# Patient Record
Sex: Male | Born: 1945
Health system: Southern US, Community
[De-identification: ages and names within clinical notes are randomized; demographics above are authoritative.]

## PROBLEM LIST (undated history)

## (undated) DIAGNOSIS — U071 COVID-19: Secondary | ICD-10-CM

## (undated) HISTORY — PX: HERNIA REPAIR: SHX51

---

## 2010-01-17 ENCOUNTER — Ambulatory Visit (HOSPITAL_COMMUNITY): Admission: RE | Admit: 2010-01-17 | Discharge: 2010-01-17 | Payer: Self-pay | Admitting: Surgery

## 2013-03-05 ENCOUNTER — Encounter (HOSPITAL_COMMUNITY): Payer: Self-pay | Admitting: *Deleted

## 2013-03-05 ENCOUNTER — Emergency Department (HOSPITAL_COMMUNITY)
Admission: EM | Admit: 2013-03-05 | Discharge: 2013-03-06 | Disposition: A | Discharge status: Home / Self Care | Payer: Medicare Other | Attending: Emergency Medicine | Admitting: Emergency Medicine

## 2013-03-05 DIAGNOSIS — R062 Wheezing: Secondary | ICD-10-CM | POA: Insufficient documentation

## 2013-03-05 DIAGNOSIS — J209 Acute bronchitis, unspecified: Secondary | ICD-10-CM | POA: Insufficient documentation

## 2013-03-05 DIAGNOSIS — J40 Bronchitis, not specified as acute or chronic: Secondary | ICD-10-CM

## 2013-03-05 DIAGNOSIS — J3489 Other specified disorders of nose and nasal sinuses: Secondary | ICD-10-CM | POA: Insufficient documentation

## 2013-03-05 DIAGNOSIS — R0982 Postnasal drip: Secondary | ICD-10-CM | POA: Insufficient documentation

## 2013-03-05 DIAGNOSIS — R05 Cough: Secondary | ICD-10-CM | POA: Insufficient documentation

## 2013-03-05 DIAGNOSIS — R059 Cough, unspecified: Secondary | ICD-10-CM | POA: Insufficient documentation

## 2013-03-05 DIAGNOSIS — F17200 Nicotine dependence, unspecified, uncomplicated: Secondary | ICD-10-CM | POA: Insufficient documentation

## 2013-03-05 DIAGNOSIS — R42 Dizziness and giddiness: Secondary | ICD-10-CM | POA: Insufficient documentation

## 2013-03-05 MED ORDER — ALBUTEROL SULFATE (5 MG/ML) 0.5% IN NEBU
5.0000 mg | INHALATION_SOLUTION | Freq: Once | RESPIRATORY_TRACT | Status: AC
  Administered 2013-03-05: 5 mg via RESPIRATORY_TRACT

## 2013-03-05 MED ORDER — ALBUTEROL SULFATE (5 MG/ML) 0.5% IN NEBU
INHALATION_SOLUTION | RESPIRATORY_TRACT | Status: AC
  Administered 2013-03-05: 5 mg via RESPIRATORY_TRACT
  Filled 2013-03-05: qty 1

## 2013-03-05 NOTE — ED Notes (Signed)
Pt started getting a cold 2 days ago and now states he "can't breathe".  Pt tachypnic, SOB, productive cough with yellow/green sputum.

## 2013-03-06 ENCOUNTER — Emergency Department (HOSPITAL_COMMUNITY): Payer: Medicare Other

## 2013-03-06 LAB — BASIC METABOLIC PANEL
CO2: 27 mEq/L (ref 19–32)
Calcium: 9.5 mg/dL (ref 8.4–10.5)
Creatinine, Ser: 0.88 mg/dL (ref 0.50–1.35)
GFR calc Af Amer: >90 mL/min (ref >90)
GFR calc non Af Amer: 88 mL/min — ABNORMAL LOW (ref >90)
Glucose, Bld: 119 mg/dL — ABNORMAL HIGH (ref 70–99)
Sodium: 136 mEq/L (ref 135–145)

## 2013-03-06 LAB — CBC
Hemoglobin: 17.2 g/dL — ABNORMAL HIGH (ref 13.0–17.0)
MCH: 31.6 pg (ref 26.0–34.0)
MCHC: 35.7 g/dL (ref 30.0–36.0)
MCV: 88.4 fL (ref 78.0–100.0)
RDW: 13 % (ref 11.5–15.5)
WBC: 14.7 10*3/uL — ABNORMAL HIGH (ref 4.0–10.5)

## 2013-03-06 MED ORDER — ALBUTEROL SULFATE (5 MG/ML) 0.5% IN NEBU
5.0000 mg | INHALATION_SOLUTION | RESPIRATORY_TRACT | Status: AC
  Administered 2013-03-06: 5 mg via RESPIRATORY_TRACT
  Filled 2013-03-06: qty 1

## 2013-03-06 MED ORDER — AZITHROMYCIN 250 MG PO TABS
500.0000 mg | ORAL_TABLET | Freq: Once | ORAL | Status: AC
  Administered 2013-03-06: 500 mg via ORAL
  Filled 2013-03-06: qty 2

## 2013-03-06 MED ORDER — IPRATROPIUM BROMIDE 0.02 % IN SOLN
0.5000 mg | Freq: Once | RESPIRATORY_TRACT | Status: AC
  Administered 2013-03-06: 0.5 mg via RESPIRATORY_TRACT
  Filled 2013-03-06: qty 2.5

## 2013-03-06 MED ORDER — ALBUTEROL SULFATE HFA 108 (90 BASE) MCG/ACT IN AERS: 1.0000 | INHALATION_SPRAY | Freq: Four times a day (QID) | RESPIRATORY_TRACT | Status: AC | PRN

## 2013-03-06 MED ORDER — AZITHROMYCIN 250 MG PO TABS: 250.0000 mg | ORAL_TABLET | Freq: Every day | ORAL | Status: DC

## 2013-03-06 NOTE — ED Provider Notes (Signed)
History     CSN: [id number]  Arrival date & time 03/05/13  2332   First MD Initiated Contact with Patient 03/06/13 0131      Chief Complaint  Patient presents with  . Shortness of Breath    (Consider location/radiation/quality/duration/timing/severity/associated sxs/prior treatment) HPI Comments: 67 yo white male w/ no significant PMH presents to the ED complaining of progressively worsening SOB x 24 hours. He states it is worse w/ exertion and laying down but denies having to sleep w/ pillows or sitting up. Assoc. sx include cough productive of green sputum, sinus congestion.Social history reveals he smokes 1pack/ day.   On ROS, pt denies fevers/ chills, myalgias, pleuritic pain, chest pain, heart palpitations, dizziness or weakness, swelling in the hands or feet.  Patient is a 67 y.o. male presenting with shortness of breath. The history is provided by the patient.  Shortness of Breath Associated symptoms: cough and wheezing   Associated symptoms: no chest pain, no fever, no headaches and no neck pain     History reviewed. No pertinent past medical history.  Past Surgical History  Procedure Laterality Date  . Hernia repair      No family history on file.  History  Substance Use Topics  . Smoking status: Current Every Day Smoker -- 1.00 packs/day  . Smokeless tobacco: Not on file  . Alcohol Use: Yes      Review of Systems  Constitutional: Positive for activity change. Negative for fever and chills.  HENT: Positive for congestion, rhinorrhea and postnasal drip. Negative for neck pain.   Eyes: Negative for visual disturbance.  Respiratory: Positive for cough, shortness of breath and wheezing. Negative for chest tightness.   Cardiovascular: Negative for chest pain.  Gastrointestinal: Negative for abdominal distention.  Genitourinary: Negative for dysuria, enuresis and difficulty urinating.  Musculoskeletal: Negative for arthralgias.  Neurological: Positive for  dizziness. Negative for light-headedness and headaches.  Psychiatric/Behavioral: Negative for confusion.    Allergies  Review of patient's allergies indicates no known allergies.  Home Medications  No current outpatient prescriptions on file.  BP 136/68  Pulse 66  Temp(Src) 97.8 F (36.6 C) (Oral)  Resp 15  SpO2 95%  Physical Exam  Nursing note and vitals reviewed. Constitutional: He is oriented to person, place, and time. He appears well-developed.  HENT:  Head: Normocephalic and atraumatic.  Eyes: Conjunctivae and EOM are normal. Pupils are equal, round, and reactive to light.  Neck: Normal range of motion. Neck supple.  Cardiovascular: Normal rate and regular rhythm.   Pulmonary/Chest: Effort normal. He has wheezes.  Fine wheezing with expiration - left sided  Abdominal: Soft. Bowel sounds are normal. He exhibits no distension. There is no tenderness. There is no rebound and no guarding.  Neurological: He is alert and oriented to person, place, and time.  Skin: Skin is warm.    ED Course  Procedures (including critical care time)  Labs Reviewed  BASIC METABOLIC PANEL - Abnormal; Notable for the following:    Glucose, Bld 119 (*)    GFR calc non Af Amer 88 (*)    All other components within normal limits  CBC - Abnormal; Notable for the following:    WBC 14.7 (*)    Hemoglobin 17.2 (*)    All other components within normal limits  POCT I-STAT TROPONIN I   Dg Chest 2 View (if Patient Has Fever And/or Copd)  03/06/2013  *RADIOLOGY REPORT*  Clinical Data: Worsening shortness of breath, congestion and chills.  History of  smoking.  CHEST - 2 VIEW  Comparison: Chest radiograph performed 01/17/2010  Findings: The lungs are well-aerated.  Minimal left basilar opacity may reflect mild pneumonia.  There is no evidence of pleural effusion or pneumothorax.  The heart is normal in size; the mediastinal contour is within normal limits.  No acute osseous abnormalities are seen.   IMPRESSION: Minimal left basilar opacity may reflect mild pneumonia.   Original Report Authenticated By: Tonia Ghent, M.D.      No diagnosis found.    MDM  Differential diagnosis includes: ACS syndrome CHF exacerbation Valvular disorder Myocarditis Pericarditis Pericardial effusion Pneumonia Pleural effusion Pulmonary edema PE Anemia Musculoskeletal pain Bronchitis Pneumonia  Pt comes in with cc of DIM with some cough and wheezing. He is a smoker. No underlying lung or heart dz. He has no hx of PE, and no risk factors for the same, Pt has no CAD, he is a male, and over 83 and a smoker. No chest pain.  Based on the hx and exam, patient has bronchitis, with some URI as well. Wheezing cleared with albuterol, and patient ambulating without any difficulty. Will treat with AZT. PCP f/u requested. Return precaution provided.     Date: 03/06/2013  Rate: 78  Rhythm: normal sinus rhythm  QRS Axis: normal  Intervals: normal  ST/T Wave abnormalities: normal  Conduction Disutrbances: none  Narrative Interpretation: unremarkable      Derwood Kaplan, MD 03/06/13 0301

## 2013-03-06 NOTE — ED Notes (Signed)
Pt's 02 did not drop below 95% while ambulating, pt did not become short of breath.

## 2017-03-26 DIAGNOSIS — I10 Essential (primary) hypertension: Secondary | ICD-10-CM | POA: Diagnosis not present

## 2017-03-26 DIAGNOSIS — J209 Acute bronchitis, unspecified: Secondary | ICD-10-CM | POA: Diagnosis not present

## 2017-03-26 DIAGNOSIS — J301 Allergic rhinitis due to pollen: Secondary | ICD-10-CM | POA: Diagnosis not present

## 2017-09-02 DIAGNOSIS — Z23 Encounter for immunization: Secondary | ICD-10-CM | POA: Diagnosis not present

## 2017-09-30 DIAGNOSIS — M7661 Achilles tendinitis, right leg: Secondary | ICD-10-CM | POA: Diagnosis not present

## 2017-09-30 DIAGNOSIS — J301 Allergic rhinitis due to pollen: Secondary | ICD-10-CM | POA: Diagnosis not present

## 2017-09-30 DIAGNOSIS — I10 Essential (primary) hypertension: Secondary | ICD-10-CM | POA: Diagnosis not present

## 2017-11-15 DIAGNOSIS — J22 Unspecified acute lower respiratory infection: Secondary | ICD-10-CM | POA: Diagnosis not present

## 2018-01-02 DIAGNOSIS — J22 Unspecified acute lower respiratory infection: Secondary | ICD-10-CM | POA: Diagnosis not present

## 2018-03-12 DIAGNOSIS — J209 Acute bronchitis, unspecified: Secondary | ICD-10-CM | POA: Diagnosis not present

## 2018-04-15 DIAGNOSIS — J209 Acute bronchitis, unspecified: Secondary | ICD-10-CM | POA: Diagnosis not present

## 2018-07-23 DIAGNOSIS — J301 Allergic rhinitis due to pollen: Secondary | ICD-10-CM | POA: Diagnosis not present

## 2018-07-23 DIAGNOSIS — J069 Acute upper respiratory infection, unspecified: Secondary | ICD-10-CM | POA: Diagnosis not present

## 2018-07-23 DIAGNOSIS — B9789 Other viral agents as the cause of diseases classified elsewhere: Secondary | ICD-10-CM | POA: Diagnosis not present

## 2018-07-23 DIAGNOSIS — I10 Essential (primary) hypertension: Secondary | ICD-10-CM | POA: Diagnosis not present

## 2018-10-21 DIAGNOSIS — J209 Acute bronchitis, unspecified: Secondary | ICD-10-CM | POA: Diagnosis not present

## 2019-06-15 DIAGNOSIS — J301 Allergic rhinitis due to pollen: Secondary | ICD-10-CM | POA: Diagnosis not present

## 2019-06-15 DIAGNOSIS — I10 Essential (primary) hypertension: Secondary | ICD-10-CM | POA: Diagnosis not present

## 2019-06-15 DIAGNOSIS — R0600 Dyspnea, unspecified: Secondary | ICD-10-CM | POA: Diagnosis not present

## 2019-07-17 ENCOUNTER — Other Ambulatory Visit: Payer: Self-pay

## 2019-07-17 ENCOUNTER — Emergency Department (HOSPITAL_COMMUNITY)
Admission: EM | Admit: 2019-07-17 | Discharge: 2019-07-17 | Disposition: A | Discharge status: Home / Self Care | Payer: PPO | Attending: Emergency Medicine | Admitting: Emergency Medicine

## 2019-07-17 ENCOUNTER — Emergency Department (HOSPITAL_COMMUNITY): Payer: PPO

## 2019-07-17 DIAGNOSIS — N3091 Cystitis, unspecified with hematuria: Secondary | ICD-10-CM | POA: Diagnosis not present

## 2019-07-17 DIAGNOSIS — R339 Retention of urine, unspecified: Secondary | ICD-10-CM | POA: Diagnosis not present

## 2019-07-17 DIAGNOSIS — K4090 Unilateral inguinal hernia, without obstruction or gangrene, not specified as recurrent: Secondary | ICD-10-CM | POA: Diagnosis not present

## 2019-07-17 DIAGNOSIS — J449 Chronic obstructive pulmonary disease, unspecified: Secondary | ICD-10-CM | POA: Diagnosis not present

## 2019-07-17 DIAGNOSIS — F17200 Nicotine dependence, unspecified, uncomplicated: Secondary | ICD-10-CM | POA: Insufficient documentation

## 2019-07-17 DIAGNOSIS — I10 Essential (primary) hypertension: Secondary | ICD-10-CM | POA: Diagnosis not present

## 2019-07-17 DIAGNOSIS — Z87891 Personal history of nicotine dependence: Secondary | ICD-10-CM | POA: Diagnosis not present

## 2019-07-17 DIAGNOSIS — R319 Hematuria, unspecified: Secondary | ICD-10-CM | POA: Diagnosis not present

## 2019-07-17 LAB — BASIC METABOLIC PANEL
Anion gap: 12 (ref 5–15)
BUN: 9 mg/dL (ref 8–23)
CO2: 23 mmol/L (ref 22–32)
Calcium: 8.8 mg/dL — ABNORMAL LOW (ref 8.9–10.3)
Chloride: 100 mmol/L (ref 98–111)
Creatinine, Ser: 0.81 mg/dL (ref 0.61–1.24)
GFR calc Af Amer: >60 mL/min (ref >60)
GFR calc non Af Amer: >60 mL/min (ref >60)
Glucose, Bld: 142 mg/dL — ABNORMAL HIGH (ref 70–99)
Potassium: 4.1 mmol/L (ref 3.5–5.1)
Sodium: 135 mmol/L (ref 135–145)

## 2019-07-17 LAB — CBC
HCT: 48.8 % (ref 39.0–52.0)
Hemoglobin: 16.1 g/dL (ref 13.0–17.0)
MCH: 30.5 pg (ref 26.0–34.0)
MCHC: 33 g/dL (ref 30.0–36.0)
MCV: 92.4 fL (ref 80.0–100.0)
Platelets: 227 10*3/uL (ref 150–400)
RBC: 5.28 MIL/uL (ref 4.22–5.81)
RDW: 13.4 % (ref 11.5–15.5)
WBC: 15.1 10*3/uL — ABNORMAL HIGH (ref 4.0–10.5)
nRBC: 0 % (ref 0.0–0.2)

## 2019-07-17 MED ORDER — ONDANSETRON HCL 4 MG/2ML IJ SOLN
4.0000 mg | Freq: Once | INTRAMUSCULAR | Status: DC
  Filled 2019-07-17: qty 2

## 2019-07-17 MED ORDER — MORPHINE SULFATE (PF) 4 MG/ML IV SOLN
4.0000 mg | Freq: Once | INTRAVENOUS | Status: DC
  Filled 2019-07-17: qty 1

## 2019-07-17 MED ORDER — SODIUM CHLORIDE 0.9 % IV SOLN
1.0000 g | Freq: Once | INTRAVENOUS | Status: AC
  Administered 2019-07-17: 1 g via INTRAVENOUS
  Filled 2019-07-17: qty 10

## 2019-07-17 NOTE — Discharge Instructions (Addendum)
It was our pleasure to provide your ER care today - we hope that you feel better.  Drink plenty of fluids.  Your ct scan was read as showing no acute process. Incidental note was made of small right inguinal hernia.   Complete the full course of your antibiotic.   Empty leg bag as need.  Follow up with urologist this coming week as planned.   Return to ER if worse, new symptoms, high fevers, catheter not draining, severe abdominal pain, or other concern.

## 2019-07-17 NOTE — ED Triage Notes (Addendum)
Per pt he was out of town and he started having urination issues and hurting with bleeding and pt had a catheter placed at another facility. Pt said it relieved his pressure but is having pain in his peins and the pain comes and goes. Pt does have blood in his urine and in the catheter bag. Pt was placed on keflex today. Pt said all this began about 8pm yesterday

## 2019-07-17 NOTE — ED Provider Notes (Signed)
[facility] Provider Note   CSN: [id number] Arrival date & time: 07/17/19  1634     History   Chief Complaint Chief Complaint  Patient presents with   Hematuria    HPI Manuel JohnsHugh H Smither Jr. is a 73 y.o. male.     Patient c/o blood in urine. Notes acute onset yesterday. States was having trouble voiding, and blood in urine - [facility]. Blood in urine since, persistent, moderate. No hx same. States at times has urge as if has to urinate, pain in penis/suprapubic area. No back or flank pain. No hx uti or kidney stone. No fever or chills. No nausea or vomiting. No other abnormal bruising or bleeding. No anticoag use.   The history is provided by the patient.  Hematuria Pertinent negatives include no chest pain, no abdominal pain, no headaches and no shortness of breath.    No past medical history on file.  There are no active problems to display for this patient.   Past Surgical History:  Procedure Laterality Date   HERNIA REPAIR          Home Medications    Prior to Admission medications   Medication Sig Start Date End Date Taking? Authorizing Provider  albuterol (PROVENTIL HFA;VENTOLIN HFA) 108 (90 BASE) MCG/ACT inhaler Inhale 1-2 puffs into the lungs every 6 (six) hours as needed for wheezing. 03/06/13   Derwood KaplanNanavati, Ankit, MD  azithromycin (ZITHROMAX) 250 MG tablet Take 1 tablet (250 mg total) by mouth daily. 1 tab q daily until finished 03/06/13   Derwood KaplanNanavati, Ankit, MD    Family History No family history on file.  Social History Social History   Tobacco Use   Smoking status: Current Every Day Smoker    Packs/day: 1.00  Substance Use Topics   Alcohol use: Yes   Drug use: No     Allergies   Patient has no known allergies.   Review of Systems Review of Systems  Constitutional: Negative for fever.  HENT: Negative for sore throat.   Eyes: Negative for redness.    Respiratory: Negative for shortness of breath.   Cardiovascular: Negative for chest pain.  Gastrointestinal: Negative for abdominal pain and vomiting.  Genitourinary: Positive for hematuria. Negative for flank pain.  Musculoskeletal: Negative for back pain.  Skin: Negative for rash.  Neurological: Negative for headaches.  Hematological: Does not bruise/bleed easily.  Psychiatric/Behavioral: Negative for confusion.     Physical Exam Updated Vital Signs BP (!) 146/85 (BP Location: Left Arm)    Pulse 73    Temp 99.1 F (37.3 C) (Oral)    Resp 18    SpO2 93%   Physical Exam Vitals signs and nursing note reviewed.  Constitutional:      Appearance: Normal appearance. He is well-developed.  HENT:     Head: Atraumatic.     Nose: Nose normal.     Mouth/Throat:     Mouth: Mucous membranes are moist.     Pharynx: Oropharynx is clear.  Eyes:     General: No scleral icterus.    Conjunctiva/sclera: Conjunctivae normal.  Neck:     Musculoskeletal: Neck supple. No neck rigidity.     Trachea: No tracheal deviation.  Cardiovascular:     Rate and Rhythm: Normal rate.     Pulses: Normal pulses.  Pulmonary:     Effort: Pulmonary effort is normal. No accessory muscle usage or respiratory distress.  Abdominal:  General: Bowel sounds are normal. There is no distension.     Palpations: Abdomen is soft. There is no mass.     Tenderness: There is no abdominal tenderness. There is no guarding.  Genitourinary:    Comments: No cva tenderness. Normal external gu exam. Foley cath in place. Hematuria with urine in tube and in bag, with a few small clots.  Musculoskeletal:        General: No swelling.  Skin:    General: Skin is warm and dry.     Findings: No rash.  Neurological:     Mental Status: He is alert.     Comments: Alert, speech clear.   Psychiatric:        Mood and Affect: Mood normal.      ED Treatments / Results  Labs (all labs ordered are listed, but only abnormal results  are displayed) Results for orders placed or performed during the hospital encounter of 07/17/19  CBC  Result Value Ref Range   WBC 15.1 (H) 4.0 - 10.5 K/uL   RBC 5.28 4.22 - 5.81 MIL/uL   Hemoglobin 16.1 13.0 - 17.0 g/dL   HCT 16.1 09.6 - 04.5 %   MCV 92.4 80.0 - 100.0 fL   MCH 30.5 26.0 - 34.0 pg   MCHC 33.0 30.0 - 36.0 g/dL   RDW 40.9 81.1 - 91.4 %   Platelets 227 150 - 400 K/uL   nRBC 0.0 0.0 - 0.2 %  Basic metabolic panel  Result Value Ref Range   Sodium 135 135 - 145 mmol/L   Potassium 4.1 3.5 - 5.1 mmol/L   Chloride 100 98 - 111 mmol/L   CO2 23 22 - 32 mmol/L   Glucose, Bld 142 (H) 70 - 99 mg/dL   BUN 9 8 - 23 mg/dL   Creatinine, Ser 7.82 0.61 - 1.24 mg/dL   Calcium 8.8 (L) 8.9 - 10.3 mg/dL   GFR calc non Af Amer >60 >60 mL/min   GFR calc Af Amer >60 >60 mL/min   Anion gap 12 5 - 15   Ct Renal Stone Study  Result Date: 07/17/2019 CLINICAL DATA:  73 year old male with acute abdominal and flank pain with hematuria. EXAM: CT ABDOMEN AND PELVIS WITHOUT CONTRAST TECHNIQUE: Multidetector CT imaging of the abdomen and pelvis was performed following the standard protocol without IV contrast. COMPARISON:  None. FINDINGS: Please note that parenchymal abnormalities may be missed without intravenous contrast. Lower chest: No acute abnormality. Hepatobiliary: Hepatic steatosis identified without focal hepatic abnormality. The gallbladder is unremarkable. No biliary dilatation. Pancreas: Unremarkable Spleen: Unremarkable Adrenals/Urinary Tract: The kidneys are unremarkable except for a probable 1.5 cm RIGHT renal cyst. There is no evidence of hydronephrosis or urinary calculi. A Foley catheter is present within the bladder. The adrenal glands are unremarkable. Stomach/Bowel: Stomach is within normal limits. Appendix appears normal. No evidence of bowel wall thickening, distention, or inflammatory changes. Colonic diverticulosis noted without evidence of diverticulitis. Vascular/Lymphatic:  Aortic atherosclerosis. No enlarged abdominal or pelvic lymph nodes. Reproductive: Prostate is unremarkable. Other: No ascites, pneumoperitoneum or focal collection. A small RIGHT inguinal hernia containing fat is noted. Musculoskeletal: No acute or suspicious bony abnormalities. IMPRESSION: 1. No evidence of acute abnormality. 2. Foley catheter within the bladder. No evidence of hydronephrosis or urinary calculi 3. Hepatic steatosis 4. Small RIGHT inguinal hernia containing fat 5.  Aortic Atherosclerosis (ICD10-I70.0). Electronically Signed   By: Harmon Pier M.D.   On: 07/17/2019 21:57    EKG None  Radiology  Ct Renal Stone Study  Result Date: 07/17/2019 CLINICAL DATA:  73 year old male with acute abdominal and flank pain with hematuria. EXAM: CT ABDOMEN AND PELVIS WITHOUT CONTRAST TECHNIQUE: Multidetector CT imaging of the abdomen and pelvis was performed following the standard protocol without IV contrast. COMPARISON:  None. FINDINGS: Please note that parenchymal abnormalities may be missed without intravenous contrast. Lower chest: No acute abnormality. Hepatobiliary: Hepatic steatosis identified without focal hepatic abnormality. The gallbladder is unremarkable. No biliary dilatation. Pancreas: Unremarkable Spleen: Unremarkable Adrenals/Urinary Tract: The kidneys are unremarkable except for a probable 1.5 cm RIGHT renal cyst. There is no evidence of hydronephrosis or urinary calculi. A Foley catheter is present within the bladder. The adrenal glands are unremarkable. Stomach/Bowel: Stomach is within normal limits. Appendix appears normal. No evidence of bowel wall thickening, distention, or inflammatory changes. Colonic diverticulosis noted without evidence of diverticulitis. Vascular/Lymphatic: Aortic atherosclerosis. No enlarged abdominal or pelvic lymph nodes. Reproductive: Prostate is unremarkable. Other: No ascites, pneumoperitoneum or focal collection. A small RIGHT inguinal hernia containing fat  is noted. Musculoskeletal: No acute or suspicious bony abnormalities. IMPRESSION: 1. No evidence of acute abnormality. 2. Foley catheter within the bladder. No evidence of hydronephrosis or urinary calculi 3. Hepatic steatosis 4. Small RIGHT inguinal hernia containing fat 5.  Aortic Atherosclerosis (ICD10-I70.0). Electronically Signed   By: Margarette Canada M.D.   On: 07/17/2019 21:57    Procedures Procedures (including critical care time)  Medications Ordered in ED Medications - No data to display   Initial Impression / Assessment and Plan / ED Course  I have reviewed the triage vital signs and the nursing notes.  Pertinent labs & imaging results that were available during my care of the patient were reviewed by me and considered in my medical decision making (see chart for details).  Labs sent.   Bladder scan.  Foley irrigated.   UA sent. Rocephin iv.   Reviewed nursing notes and prior charts for additional history.   Nurse with trouble irrigated foley/painful for pt. Old foley removed, new large fr foley placed. Urine draining in catheter.   Pt/fam request ct.   Labs reviewed by me - chem normal.   CT reviewed by me - no ureteral stone.   Recheck abd soft nt.   Recheck symptoms much improved. Foley draining. abd soft nt. Afebrile.   Patient currently appears stable for d/c.   Pt is to continue his home abx, keflex. F/u urologist in the coming week.   Return precautions provided.     Final Clinical Impressions(s) / ED Diagnoses   Final diagnoses:  None    ED Discharge Orders    None       Lajean Saver, MD 07/17/19 2218

## 2019-07-21 DIAGNOSIS — R338 Other retention of urine: Secondary | ICD-10-CM | POA: Diagnosis not present

## 2019-07-28 DIAGNOSIS — R338 Other retention of urine: Secondary | ICD-10-CM | POA: Diagnosis not present

## 2019-07-28 DIAGNOSIS — R310 Gross hematuria: Secondary | ICD-10-CM | POA: Diagnosis not present

## 2019-08-13 DIAGNOSIS — Z136 Encounter for screening for cardiovascular disorders: Secondary | ICD-10-CM | POA: Diagnosis not present

## 2019-08-13 DIAGNOSIS — Z13220 Encounter for screening for lipoid disorders: Secondary | ICD-10-CM | POA: Diagnosis not present

## 2019-08-13 DIAGNOSIS — Z125 Encounter for screening for malignant neoplasm of prostate: Secondary | ICD-10-CM | POA: Diagnosis not present

## 2019-10-08 DIAGNOSIS — Z20828 Contact with and (suspected) exposure to other viral communicable diseases: Secondary | ICD-10-CM | POA: Diagnosis not present

## 2019-10-08 DIAGNOSIS — R1013 Epigastric pain: Secondary | ICD-10-CM | POA: Diagnosis not present

## 2019-10-12 DIAGNOSIS — R1013 Epigastric pain: Secondary | ICD-10-CM | POA: Diagnosis not present

## 2019-10-27 DIAGNOSIS — Z87448 Personal history of other diseases of urinary system: Secondary | ICD-10-CM | POA: Diagnosis not present

## 2019-11-09 ENCOUNTER — Other Ambulatory Visit: Payer: Self-pay | Admitting: Physician Assistant

## 2019-11-09 DIAGNOSIS — R1013 Epigastric pain: Secondary | ICD-10-CM | POA: Diagnosis not present

## 2019-11-18 ENCOUNTER — Ambulatory Visit
Admission: RE | Admit: 2019-11-18 | Discharge: 2019-11-18 | Disposition: A | Discharge status: Home / Self Care | Payer: PPO | Source: Ambulatory Visit | Attending: Physician Assistant | Admitting: Physician Assistant

## 2019-11-18 DIAGNOSIS — K7689 Other specified diseases of liver: Secondary | ICD-10-CM | POA: Diagnosis not present

## 2019-11-18 DIAGNOSIS — N281 Cyst of kidney, acquired: Secondary | ICD-10-CM | POA: Diagnosis not present

## 2019-11-18 DIAGNOSIS — R1013 Epigastric pain: Secondary | ICD-10-CM

## 2019-12-01 DIAGNOSIS — J209 Acute bronchitis, unspecified: Secondary | ICD-10-CM | POA: Diagnosis not present

## 2019-12-01 DIAGNOSIS — U071 COVID-19: Secondary | ICD-10-CM | POA: Diagnosis not present

## 2019-12-01 DIAGNOSIS — I10 Essential (primary) hypertension: Secondary | ICD-10-CM | POA: Diagnosis not present

## 2019-12-01 DIAGNOSIS — R1013 Epigastric pain: Secondary | ICD-10-CM | POA: Diagnosis not present

## 2019-12-01 DIAGNOSIS — J301 Allergic rhinitis due to pollen: Secondary | ICD-10-CM | POA: Diagnosis not present

## 2019-12-01 DIAGNOSIS — J0140 Acute pansinusitis, unspecified: Secondary | ICD-10-CM | POA: Diagnosis not present

## 2019-12-05 ENCOUNTER — Inpatient Hospital Stay (HOSPITAL_COMMUNITY)
Admission: EM | Admit: 2019-12-05 | Discharge: 2019-12-14 | DRG: 177 | Disposition: A | Discharge status: Home / Self Care | Payer: PPO | Attending: Internal Medicine | Admitting: Internal Medicine

## 2019-12-05 ENCOUNTER — Encounter (HOSPITAL_COMMUNITY): Payer: Self-pay | Admitting: Emergency Medicine

## 2019-12-05 ENCOUNTER — Emergency Department (HOSPITAL_COMMUNITY): Payer: PPO

## 2019-12-05 ENCOUNTER — Other Ambulatory Visit: Payer: Self-pay

## 2019-12-05 DIAGNOSIS — R7989 Other specified abnormal findings of blood chemistry: Secondary | ICD-10-CM | POA: Diagnosis not present

## 2019-12-05 DIAGNOSIS — Z7951 Long term (current) use of inhaled steroids: Secondary | ICD-10-CM

## 2019-12-05 DIAGNOSIS — E119 Type 2 diabetes mellitus without complications: Secondary | ICD-10-CM | POA: Diagnosis not present

## 2019-12-05 DIAGNOSIS — Z801 Family history of malignant neoplasm of trachea, bronchus and lung: Secondary | ICD-10-CM | POA: Diagnosis not present

## 2019-12-05 DIAGNOSIS — J9601 Acute respiratory failure with hypoxia: Secondary | ICD-10-CM | POA: Diagnosis not present

## 2019-12-05 DIAGNOSIS — J45902 Unspecified asthma with status asthmaticus: Secondary | ICD-10-CM | POA: Diagnosis not present

## 2019-12-05 DIAGNOSIS — Z79899 Other long term (current) drug therapy: Secondary | ICD-10-CM | POA: Diagnosis not present

## 2019-12-05 DIAGNOSIS — E871 Hypo-osmolality and hyponatremia: Secondary | ICD-10-CM

## 2019-12-05 DIAGNOSIS — Z8249 Family history of ischemic heart disease and other diseases of the circulatory system: Secondary | ICD-10-CM | POA: Diagnosis not present

## 2019-12-05 DIAGNOSIS — Z7982 Long term (current) use of aspirin: Secondary | ICD-10-CM | POA: Diagnosis not present

## 2019-12-05 DIAGNOSIS — R000 Tachycardia, unspecified: Secondary | ICD-10-CM | POA: Diagnosis not present

## 2019-12-05 DIAGNOSIS — I10 Essential (primary) hypertension: Secondary | ICD-10-CM

## 2019-12-05 DIAGNOSIS — R0602 Shortness of breath: Secondary | ICD-10-CM | POA: Diagnosis not present

## 2019-12-05 DIAGNOSIS — Z87891 Personal history of nicotine dependence: Secondary | ICD-10-CM

## 2019-12-05 DIAGNOSIS — E669 Obesity, unspecified: Secondary | ICD-10-CM | POA: Diagnosis present

## 2019-12-05 DIAGNOSIS — F419 Anxiety disorder, unspecified: Secondary | ICD-10-CM | POA: Diagnosis present

## 2019-12-05 DIAGNOSIS — J45909 Unspecified asthma, uncomplicated: Secondary | ICD-10-CM | POA: Diagnosis not present

## 2019-12-05 DIAGNOSIS — U071 COVID-19: Secondary | ICD-10-CM | POA: Diagnosis not present

## 2019-12-05 DIAGNOSIS — R918 Other nonspecific abnormal finding of lung field: Secondary | ICD-10-CM | POA: Diagnosis not present

## 2019-12-05 DIAGNOSIS — R590 Localized enlarged lymph nodes: Secondary | ICD-10-CM | POA: Diagnosis not present

## 2019-12-05 DIAGNOSIS — K219 Gastro-esophageal reflux disease without esophagitis: Secondary | ICD-10-CM | POA: Diagnosis not present

## 2019-12-05 DIAGNOSIS — R0600 Dyspnea, unspecified: Secondary | ICD-10-CM

## 2019-12-05 DIAGNOSIS — Z6841 Body Mass Index (BMI) 40.0 and over, adult: Secondary | ICD-10-CM

## 2019-12-05 DIAGNOSIS — J1282 Pneumonia due to coronavirus disease 2019: Secondary | ICD-10-CM | POA: Diagnosis not present

## 2019-12-05 DIAGNOSIS — I959 Hypotension, unspecified: Secondary | ICD-10-CM | POA: Diagnosis not present

## 2019-12-05 HISTORY — DX: COVID-19: U07.1

## 2019-12-05 LAB — LACTIC ACID, PLASMA: Lactic Acid, Venous: 3.1 mmol/L (ref 0.5–1.9)

## 2019-12-05 LAB — URINALYSIS, ROUTINE W REFLEX MICROSCOPIC
Bilirubin Urine: NEGATIVE
Glucose, UA: NEGATIVE mg/dL
Ketones, ur: NEGATIVE mg/dL
Leukocytes,Ua: NEGATIVE
Nitrite: NEGATIVE
Protein, ur: 100 mg/dL — AB
Specific Gravity, Urine: 1.032 — ABNORMAL HIGH (ref 1.005–1.030)
pH: 5 (ref 5.0–8.0)

## 2019-12-05 LAB — C-REACTIVE PROTEIN: CRP: 32.7 mg/dL — ABNORMAL HIGH (ref <1.0)

## 2019-12-05 LAB — COMPREHENSIVE METABOLIC PANEL
ALT: 41 U/L (ref 0–44)
AST: 34 U/L (ref 15–41)
Albumin: 3.3 g/dL — ABNORMAL LOW (ref 3.5–5.0)
Alkaline Phosphatase: 128 U/L — ABNORMAL HIGH (ref 38–126)
Anion gap: 14 (ref 5–15)
BUN: 14 mg/dL (ref 8–23)
CO2: 20 mmol/L — ABNORMAL LOW (ref 22–32)
Calcium: 8.7 mg/dL — ABNORMAL LOW (ref 8.9–10.3)
Chloride: 93 mmol/L — ABNORMAL LOW (ref 98–111)
Creatinine, Ser: 0.95 mg/dL (ref 0.61–1.24)
GFR calc Af Amer: >60 mL/min (ref >60)
GFR calc non Af Amer: >60 mL/min (ref >60)
Glucose, Bld: 157 mg/dL — ABNORMAL HIGH (ref 70–99)
Potassium: 4.1 mmol/L (ref 3.5–5.1)
Sodium: 127 mmol/L — ABNORMAL LOW (ref 135–145)
Total Bilirubin: 1.9 mg/dL — ABNORMAL HIGH (ref 0.3–1.2)
Total Protein: 7.4 g/dL (ref 6.5–8.1)

## 2019-12-05 LAB — CBC WITH DIFFERENTIAL/PLATELET
Abs Immature Granulocytes: 0.16 10*3/uL — ABNORMAL HIGH (ref 0.00–0.07)
Basophils Absolute: 0.1 10*3/uL (ref 0.0–0.1)
Basophils Relative: 0 %
Eosinophils Absolute: 0.1 10*3/uL (ref 0.0–0.5)
Eosinophils Relative: 1 %
HCT: 49.5 % (ref 39.0–52.0)
Hemoglobin: 15.8 g/dL (ref 13.0–17.0)
Immature Granulocytes: 1 %
Lymphocytes Relative: 5 %
Lymphs Abs: 0.8 10*3/uL (ref 0.7–4.0)
MCH: 29.7 pg (ref 26.0–34.0)
MCHC: 31.9 g/dL (ref 30.0–36.0)
MCV: 93 fL (ref 80.0–100.0)
Monocytes Absolute: 1.5 10*3/uL — ABNORMAL HIGH (ref 0.1–1.0)
Monocytes Relative: 9 %
Neutro Abs: 14.8 10*3/uL — ABNORMAL HIGH (ref 1.7–7.7)
Neutrophils Relative %: 84 %
Platelets: 235 10*3/uL (ref 150–400)
RBC: 5.32 MIL/uL (ref 4.22–5.81)
RDW: 13.5 % (ref 11.5–15.5)
WBC: 17.4 10*3/uL — ABNORMAL HIGH (ref 4.0–10.5)
nRBC: 0 % (ref 0.0–0.2)

## 2019-12-05 LAB — PROCALCITONIN: Procalcitonin: 0.76 ng/mL

## 2019-12-05 LAB — BRAIN NATRIURETIC PEPTIDE: B Natriuretic Peptide: 76.2 pg/mL (ref 0.0–100.0)

## 2019-12-05 LAB — TRIGLYCERIDES: Triglycerides: 77 mg/dL (ref <150)

## 2019-12-05 LAB — LACTATE DEHYDROGENASE: LDH: 216 U/L — ABNORMAL HIGH (ref 98–192)

## 2019-12-05 LAB — D-DIMER, QUANTITATIVE: D-Dimer, Quant: 2.93 ug/mL-FEU — ABNORMAL HIGH (ref 0.00–0.50)

## 2019-12-05 LAB — FERRITIN: Ferritin: 608 ng/mL — ABNORMAL HIGH (ref 24–336)

## 2019-12-05 MED ORDER — SODIUM CHLORIDE 0.9 % IV SOLN
100.0000 mg | Freq: Every day | INTRAVENOUS | Status: AC
  Administered 2019-12-06 – 2019-12-09 (×4): 100 mg via INTRAVENOUS
  Filled 2019-12-05 (×5): qty 20

## 2019-12-05 MED ORDER — ALBUTEROL SULFATE HFA 108 (90 BASE) MCG/ACT IN AERS
4.0000 | INHALATION_SPRAY | Freq: Once | RESPIRATORY_TRACT | Status: AC
  Administered 2019-12-05: 4 via RESPIRATORY_TRACT
  Filled 2019-12-05: qty 6.7

## 2019-12-05 MED ORDER — DEXAMETHASONE SODIUM PHOSPHATE 10 MG/ML IJ SOLN
6.0000 mg | Freq: Once | INTRAMUSCULAR | Status: AC
  Administered 2019-12-05: 6 mg via INTRAVENOUS
  Filled 2019-12-05: qty 1

## 2019-12-05 MED ORDER — DEXAMETHASONE SODIUM PHOSPHATE 10 MG/ML IJ SOLN
6.0000 mg | INTRAMUSCULAR | Status: DC
  Administered 2019-12-06 – 2019-12-11 (×5): 6 mg via INTRAVENOUS
  Filled 2019-12-05 (×4): qty 1

## 2019-12-05 MED ORDER — SODIUM CHLORIDE 0.9 % IV SOLN
200.0000 mg | Freq: Once | INTRAVENOUS | Status: AC
  Administered 2019-12-05: 23:00:00 200 mg via INTRAVENOUS
  Filled 2019-12-05: qty 200

## 2019-12-05 MED ORDER — SODIUM CHLORIDE 0.9% FLUSH: 3.0000 mL | Freq: Once | INTRAVENOUS | Status: DC

## 2019-12-05 MED ORDER — ENOXAPARIN SODIUM 40 MG/0.4ML ~~LOC~~ SOLN: 40.0000 mg | SUBCUTANEOUS | Status: DC

## 2019-12-05 MED ORDER — ACETAMINOPHEN 325 MG PO TABS
650.0000 mg | ORAL_TABLET | Freq: Once | ORAL | Status: AC
  Administered 2019-12-05: 20:00:00 650 mg via ORAL
  Filled 2019-12-05: qty 2

## 2019-12-05 MED ORDER — ACETAMINOPHEN 325 MG PO TABS
650.0000 mg | ORAL_TABLET | Freq: Four times a day (QID) | ORAL | Status: DC | PRN
  Administered 2019-12-06 (×2): 650 mg via ORAL
  Filled 2019-12-05 (×2): qty 2

## 2019-12-05 NOTE — ED Triage Notes (Deleted)
To ED from home with c/o poor appetite, not eating or drinking due to covid symptoms-- cannot taste or smell-- covid 11 days ago

## 2019-12-05 NOTE — ED Provider Notes (Signed)
[facility] EMERGENCY DEPARTMENT Provider Note   CSN: [id number] Arrival date & time: 12/05/19  1634     History Chief Complaint  Patient presents with  . covid symptoms  . Shortness of Breath    Manuel Obst. is a 74 y.o. male.  The history is provided by the patient and medical records. No language interpreter was used.  Shortness of Breath  Manuel Malerba. is a 74 y.o. male who presents to the Emergency Department complaining of shortness of breath. He presents the emergency department from home for evaluation of progressive shortness of breath over the last three days. He was diagnosed with COVID-19 infection five days ago. He initially had fevers, nausea, mild cough and shortness of breath. Symptoms progressively worsened over the last few days and he is now significantly short of breath with minimal exertion. He denies any chest pain. Cough is occasionally productive. He denies any abdominal pain, leg swelling or pain. He states that he has been short of breath similarly in the past and responded well to inhalers. Symptoms are severe, constant, worsening.    Past Medical History:  Diagnosis Date  . COVID-19     Patient Active Problem List   Diagnosis Date Noted  . Acute respiratory failure with hypoxia (HCC) 12/05/2019  . COVID-19 virus infection 12/05/2019  . Hyponatremia 12/05/2019  . Tachycardia 12/05/2019    Past Surgical History:  Procedure Laterality Date  . HERNIA REPAIR         Family History  Problem Relation Age of Onset  . Hypertension Mother   . Lung cancer Father   . Hypertension Father     Social History   Tobacco Use  . Smoking status: Former Smoker    Packs/day: 1.00  . Smokeless tobacco: Never Used  . Tobacco comment: quit 8 years ago  Substance Use Topics  . Alcohol use: Yes  . Drug use: No    Home Medications Prior to Admission medications   Medication Sig Start Date End Date Taking? Authorizing Provider    albuterol (PROVENTIL HFA;VENTOLIN HFA) 108 (90 BASE) MCG/ACT inhaler Inhale 1-2 puffs into the lungs every 6 (six) hours as needed for wheezing. 03/06/13   Derwood Kaplan, MD  amoxicillin (AMOXIL) 875 MG tablet Take 875 mg by mouth 2 (two) times daily. 12/01/19   [provider]  aspirin EC 81 MG tablet Take 81 mg by mouth daily.    [provider]  budesonide-formoterol (SYMBICORT) 160-4.5 MCG/ACT inhaler Inhale 1 puff into the lungs every morning. 07/12/19   [provider]  famotidine (PEPCID) 20 MG tablet Take 20 mg by mouth 2 (two) times daily. 11/09/19   [provider]  fluticasone (FLONASE) 50 MCG/ACT nasal spray Place 1-2 sprays into both nostrils daily. 12/01/19   [provider]  ibuprofen (ADVIL) 200 MG tablet Take 200-400 mg by mouth every 6 (six) hours as needed (pain).    [provider]  lisinopril (ZESTRIL) 20 MG tablet Take 20 mg by mouth daily. 06/15/19   [provider]    Allergies    Patient has no known allergies.  Review of Systems   Review of Systems  Respiratory: Positive for shortness of breath.   All other systems reviewed and are negative.   Physical Exam Updated Vital Signs BP (!) 143/66   Pulse 85   Temp (!) 100.7 F (38.2 C) (Oral)   Resp (!) 26   SpO2 (!) 89%   Physical  Exam Vitals and nursing note reviewed.  Constitutional:      Appearance: He is well-developed.  HENT:     Head: Normocephalic and atraumatic.  Cardiovascular:     Rate and Rhythm: Regular rhythm. Tachycardia present.     Heart sounds: No murmur.  Pulmonary:     Comments: Tachypnea.  Decreased air movement bilaterally with occasional end expiratory wheezes.   Abdominal:     Palpations: Abdomen is soft.     Tenderness: There is no abdominal tenderness. There is no guarding or rebound.  Musculoskeletal:        General: No swelling or tenderness.  Skin:    General: Skin is warm and dry.  Neurological:     Mental  Status: He is alert and oriented to person, place, and time.  Psychiatric:        Behavior: Behavior normal.     ED Results / Procedures / Treatments   Labs (all labs ordered are listed, but only abnormal results are displayed) Labs Reviewed  LACTIC ACID, PLASMA - Abnormal; Notable for the following components:      Result Value   Lactic Acid, Venous 3.1 (*)    All other components within normal limits  COMPREHENSIVE METABOLIC PANEL - Abnormal; Notable for the following components:   Sodium 127 (*)    Chloride 93 (*)    CO2 20 (*)    Glucose, Bld 157 (*)    Calcium 8.7 (*)    Albumin 3.3 (*)    Alkaline Phosphatase 128 (*)    Total Bilirubin 1.9 (*)    All other components within normal limits  CBC WITH DIFFERENTIAL/PLATELET - Abnormal; Notable for the following components:   WBC 17.4 (*)    Neutro Abs 14.8 (*)    Monocytes Absolute 1.5 (*)    Abs Immature Granulocytes 0.16 (*)    All other components within normal limits  URINALYSIS, ROUTINE W REFLEX MICROSCOPIC - Abnormal; Notable for the following components:   Color, Urine AMBER (*)    APPearance HAZY (*)    Specific Gravity, Urine 1.032 (*)    Hgb urine dipstick LARGE (*)    Protein, ur 100 (*)    Bacteria, UA RARE (*)    All other components within normal limits  D-DIMER, QUANTITATIVE (NOT AT [facility]) - Abnormal; Notable for the following components:   D-Dimer, Quant 2.93 (*)    All other components within normal limits  LACTATE DEHYDROGENASE - Abnormal; Notable for the following components:   LDH 216 (*)    All other components within normal limits  FERRITIN - Abnormal; Notable for the following components:   Ferritin 608 (*)    All other components within normal limits  FIBRINOGEN - Abnormal; Notable for the following components:   Fibrinogen >800 (*)    All other components within normal limits  C-REACTIVE PROTEIN - Abnormal; Notable for the following components:   CRP 32.7 (*)    All other components within  normal limits  CULTURE, BLOOD (ROUTINE X 2)  CULTURE, BLOOD (ROUTINE X 2)  PROCALCITONIN  BRAIN NATRIURETIC PEPTIDE  TRIGLYCERIDES  CBC WITH DIFFERENTIAL/PLATELET  COMPREHENSIVE METABOLIC PANEL  ABO/RH  TROPONIN I (HIGH SENSITIVITY)  TROPONIN I (HIGH SENSITIVITY)    EKG None  Radiology DG Chest Portable 1 View  Result Date: 12/05/2019 CLINICAL DATA:  Shortness of breath, COVID-19 positive on Tuesday, increasing shortness of breath the past few days. Smoker. EXAM: PORTABLE CHEST 1 VIEW COMPARISON:  Radiograph February 10, 2016 FINDINGS:  Some mixed interstitial airspace opacities with basilar and peripheral predominance. Low lung volumes likely some superimposed atelectatic changes as well. Prominence of the cardiomediastinal silhouette is likely accentuated by low volumes and portable technique. Remaining cardiomediastinal contours are unremarkable. No acute osseous or soft tissue abnormality. Degenerative changes are present in the imaged spine and shoulders. Telemetry leads overlie the chest. IMPRESSION: Some mixed interstitial and airspace opacities with basilar and peripheral predominance. Findings are compatible with acute infection in the setting of COVID-19 positivity though edema could have a similar appearance. Electronically Signed   By: Kreg Shropshire M.D.   On: 12/05/2019 18:58    Procedures Procedures (including critical care time) CRITICAL CARE Performed by: Tilden Fossa   Total critical care time: 35 minutes  Critical care time was exclusive of separately billable procedures and treating other patients.  Critical care was necessary to treat or prevent imminent or life-threatening deterioration.  Critical care was time spent personally by me on the following activities: development of treatment plan with patient and/or surrogate as well as nursing, discussions with consultants, evaluation of patient's response to treatment, examination of patient, obtaining history from  patient or surrogate, ordering and performing treatments and interventions, ordering and review of laboratory studies, ordering and review of radiographic studies, pulse oximetry and re-evaluation of patient's condition.  Medications Ordered in ED Medications  sodium chloride flush (NS) 0.9 % injection 3 mL (3 mLs Intravenous Not Given 12/05/19 1944)  enoxaparin (LOVENOX) injection 40 mg (has no administration in time range)  remdesivir 200 mg in sodium chloride 0.9% 250 mL IVPB (200 mg Intravenous New Bag/Given 12/05/19 2329)    Followed by  remdesivir 100 mg in sodium chloride 0.9 % 100 mL IVPB (has no administration in time range)  dexamethasone (DECADRON) injection 6 mg (has no administration in time range)  acetaminophen (TYLENOL) tablet 650 mg (has no administration in time range)  acetaminophen (TYLENOL) tablet 650 mg (650 mg Oral Given 12/05/19 1938)  dexamethasone (DECADRON) injection 6 mg (6 mg Intravenous Given 12/05/19 1938)  albuterol (VENTOLIN HFA) 108 (90 Base) MCG/ACT inhaler 4 puff (4 puffs Inhalation Given 12/05/19 1938)    ED Course  I have reviewed the triage vital signs and the nursing notes.  Pertinent labs & imaging results that were available during my care of the patient were reviewed by me and considered in my medical decision making (see chart for details).    MDM Rules/Calculators/A&P                      Patient with recent diagnosis of COVID-19 infection here for evaluation of progressive shortness of breath. Patient and respiratory distress on ED arrival with tachypnea, hypoxia. He was placed on supplemental oxygen. He was treated with albuterol with some symptomatic improvement but no change in his oxygen saturation's. His lactic acid is mildly elevated but current presentation is not consistent with sepsis.  Patient updated findings of studies and recommendation for admission and he is in agreement with treatment plan. Hospitalist consulted for admission for  further evaluation and treatment.   Manuel Shepard. was evaluated in Emergency Department on 12/05/2019 for the symptoms described in the history of present illness. He was evaluated in the context of the global COVID-19 pandemic, which necessitated consideration that the patient might be at risk for infection with the SARS-CoV-2 virus that causes COVID-19. Institutional protocols and algorithms that pertain to the evaluation of patients at risk for COVID-19 are in a state  of rapid change based on information released by regulatory bodies including the CDC and federal and state organizations. These policies and algorithms were followed during the patient's care in the ED.  Final Clinical Impression(s) / ED Diagnoses Final diagnoses:  Pneumonia due to COVID-19 virus  Acute respiratory failure with hypoxia [facility])    Rx / DC Orders ED Discharge Orders    None       Quintella Reichert, MD 12/05/19 2344

## 2019-12-05 NOTE — ED Notes (Addendum)
Spoke with daughter -- Roanna Raider -- [phone] - updated

## 2019-12-05 NOTE — ED Triage Notes (Signed)
To ED from home, covid positive, since Tuesday-- increasing shortness of breath over the past few days-- sats on room air= 85%-88%-- placed on O2 at 2l/m/[place] in triage

## 2019-12-05 NOTE — H&P (Signed)
TRH H&P    Patient Demographics:    Manuel Castillo, is a 74 y.o. male  MRN: [id number]  DOB - September 02, 1946  Admit Date - 12/05/2019  Referring MD/NP/PA: Quintella Reichert  Outpatient Primary MD for the patient is [place] [place] [facility]  Patient coming from:  home  Chief complaint- dyspnea   HPI:    Manuel Castillo  is a 74 y.o. male, w hypertension, asthma, apparently complains of dyspnea and cough (dry), for the past 4 days along with subjective fever. Slight headache, no alteration in sense of taste or smell.   Pt denies cp, palp, n/v, diarrhea.   In ED,  T 100.7, P 99, R 15, Bp 134/75  Pox 90% on RA  Na 127, K 4.1, Bun 14, Creatinine 0.95  Ast 34, Alt 41, Alk phos 128, T. Bili 1.9 Wbc 17.4, Hgb 15.8, Plt 235 Blood culture x2 Lactic acid 3.1 Ldh 216  D dimer 2.93  Covid -19 +  CXR  IMPRESSION: Some mixed interstitial and airspace opacities with basilar and peripheral predominance. Findings are compatible with acute infection in the setting of COVID-19 positivity though edema could have a similar appearance.  Pt will be admitted for Acute respiratory failure with hypoxia secondary to covid-19 infection.      Review of systems:    In addition to the HPI above,  No Fever-chills, No Headache, No changes with Vision or hearing, No problems swallowing food or Liquids, No Chest pain, Cough or Shortness of Breath, No Abdominal pain, No Nausea or Vomiting, bowel movements are regular, No Blood in stool or Urine, No dysuria, No new skin rashes or bruises, No new joints pains-aches,  No new weakness, tingling, numbness in any extremity, No recent weight gain or loss, No polyuria, polydypsia or polyphagia, No significant Mental Stressors.  All other systems reviewed and are negative.    Past History of the following :    Past Medical History:  Diagnosis Date  . COVID-19         Past Surgical History:  Procedure Laterality Date  . HERNIA REPAIR        Social History:      Social History   Tobacco Use  . Smoking status: Former Smoker    Packs/day: 1.00  . Smokeless tobacco: Never Used  . Tobacco comment: quit 8 years ago  Substance Use Topics  . Alcohol use: Yes       Family History :     Family History  Problem Relation Age of Onset  . Hypertension Mother   . Lung cancer Father   . Hypertension Father        Home Medications:   Prior to Admission medications   Medication Sig Start Date End Date Taking? Authorizing Provider  albuterol (PROVENTIL HFA;VENTOLIN HFA) 108 (90 BASE) MCG/ACT inhaler Inhale 1-2 puffs into the lungs every 6 (six) hours as needed for wheezing. 03/06/13   Varney Biles, MD  amoxicillin (AMOXIL) 875 MG tablet Take 875 mg by mouth 2 (two) times daily. 12/01/19  [provider]  aspirin EC 81 MG tablet Take 81 mg by mouth daily.    [provider]  budesonide-formoterol (SYMBICORT) 160-4.5 MCG/ACT inhaler Inhale 1 puff into the lungs every morning. 07/12/19   [provider]  famotidine (PEPCID) 20 MG tablet Take 20 mg by mouth 2 (two) times daily. 11/09/19   [provider]  fluticasone (FLONASE) 50 MCG/ACT nasal spray Place 1-2 sprays into both nostrils daily. 12/01/19   [provider]  ibuprofen (ADVIL) 200 MG tablet Take 200-400 mg by mouth every 6 (six) hours as needed (pain).    [provider]  lisinopril (ZESTRIL) 20 MG tablet Take 20 mg by mouth daily. 06/15/19   [provider]     Allergies:    No Known Allergies   Physical Exam:   Vitals  Blood pressure (!) 142/61, pulse 87, temperature 98.2 F (36.8 C), temperature source Oral, resp. rate 20, height '5\' 8"'  (1.727 m), weight 124.7 kg, SpO2 92 %.  1.  General: axoxo3  2. Psychiatric: euthymic  3. Neurologic: Cn 2-12 intact, reflexes 2+ symmetric, diffuse with no clonus, motor 5/5  in all 4 ext  4. HEENMT:  Anicteric, pupils 1.74m symmetric, direct, consensual intact Neck: no jvd, no bruit,    5. Respiratory : Slight crackles at bilateral base, no wheezing  6. Cardiovascular : Tachy s1, s2, no m/g/r  7. Gastrointestinal:  Abd: soft, nt, nd, +bs, obese  8. Skin:  Ext: no c/c/e, no rash  9.Musculoskeletal:  Good ROM    Data Review:    CBC Recent Labs  Lab 12/05/19 1857  WBC 17.4*  HGB 15.8  HCT 49.5  PLT 235  MCV 93.0  MCH 29.7  MCHC 31.9  RDW 13.5  LYMPHSABS 0.8  MONOABS 1.5*  EOSABS 0.1  BASOSABS 0.1   ------------------------------------------------------------------------------------------------------------------  Results for orders placed or performed during the hospital encounter of 12/05/19 (from the past 48 hour(s))  Comprehensive metabolic panel     Status: Abnormal   Collection Time: 12/05/19  6:57 PM  Result Value Ref Range   Sodium 127 (L) 135 - 145 mmol/L   Potassium 4.1 3.5 - 5.1 mmol/L   Chloride 93 (L) 98 - 111 mmol/L   CO2 20 (L) 22 - 32 mmol/L   Glucose, Bld 157 (H) 70 - 99 mg/dL   BUN 14 8 - 23 mg/dL   Creatinine, Ser 0.95 0.61 - 1.24 mg/dL   Calcium 8.7 (L) 8.9 - 10.3 mg/dL   Total Protein 7.4 6.5 - 8.1 g/dL   Albumin 3.3 (L) 3.5 - 5.0 g/dL   AST 34 15 - 41 U/L   ALT 41 0 - 44 U/L   Alkaline Phosphatase 128 (H) 38 - 126 U/L   Total Bilirubin 1.9 (H) 0.3 - 1.2 mg/dL   GFR calc non Af Amer >60 >60 mL/min   GFR calc Af Amer >60 >60 mL/min   Anion gap 14 5 - 15    Comment: Performed at [facility] [address], [place] [id number] CBC with Differential     Status: Abnormal   Collection Time: 12/05/19  6:57 PM  Result Value Ref Range   WBC 17.4 (H) 4.0 - 10.5 K/uL   RBC 5.32 4.22 - 5.81 MIL/uL   Hemoglobin 15.8 13.0 - 17.0 g/dL   HCT 49.5 39.0 - 52.0 %   MCV 93.0 80.0 - 100.0 fL   MCH 29.7 26.0 - 34.0 pg  MCHC 31.9 30.0 - 36.0 g/dL   RDW 13.5 11.5 - 15.5 %   Platelets 235 150 - 400 K/uL     nRBC 0.0 0.0 - 0.2 %   Neutrophils Relative % 84 %   Neutro Abs 14.8 (H) 1.7 - 7.7 K/uL   Lymphocytes Relative 5 %   Lymphs Abs 0.8 0.7 - 4.0 K/uL   Monocytes Relative 9 %   Monocytes Absolute 1.5 (H) 0.1 - 1.0 K/uL   Eosinophils Relative 1 %   Eosinophils Absolute 0.1 0.0 - 0.5 K/uL   Basophils Relative 0 %   Basophils Absolute 0.1 0.0 - 0.1 K/uL   Immature Granulocytes 1 %   Abs Immature Granulocytes 0.16 (H) 0.00 - 0.07 K/uL    Comment: Performed at [facility] [address] [place], [place] [address]  Lactic acid, plasma     Status: Abnormal   Collection Time: 12/05/19  8:14 PM  Result Value Ref Range   Lactic Acid, Venous 3.1 (HH) 0.5 - 1.9 mmol/L    Comment: CRITICAL RESULT CALLED TO, READ BACK BY AND VERIFIED WITH: Erasmo Score [facility] 12/05/19 2108 WAYK Performed at [facility], [place] [address] [place], [place] [address]   Urinalysis, Routine w reflex microscopic     Status: Abnormal   Collection Time: 12/05/19  8:15 PM  Result Value Ref Range   Color, Urine AMBER (A) YELLOW    Comment: BIOCHEMICALS MAY BE AFFECTED BY COLOR   APPearance HAZY (A) CLEAR   Specific Gravity, Urine 1.032 (H) 1.005 - 1.030   pH 5.0 5.0 - 8.0   Glucose, UA NEGATIVE NEGATIVE mg/dL   Hgb urine dipstick LARGE (A) NEGATIVE   Bilirubin Urine NEGATIVE NEGATIVE   Ketones, ur NEGATIVE NEGATIVE mg/dL   Protein, ur 100 (A) NEGATIVE mg/dL   Nitrite NEGATIVE NEGATIVE   Leukocytes,Ua NEGATIVE NEGATIVE   RBC / HPF 21-50 0 - 5 RBC/hpf   WBC, UA 6-10 0 - 5 WBC/hpf   Bacteria, UA RARE (A) NONE SEEN   Mucus PRESENT     Comment: Performed at [facility], [address], [place], [place] [address]  D-dimer, quantitative     Status: Abnormal   Collection Time: 12/05/19  8:15 PM  Result Value Ref Range   D-Dimer, Quant 2.93 (H) 0.00 - 0.50 ug/mL-FEU    Comment: (NOTE) At the manufacturer cut-off of 0.50 ug/mL FEU, this assay has been documented to exclude PE with a sensitivity and  negative predictive value of 97 to 99%.  At this time, this assay has not been approved by the FDA to exclude DVT/VTE. Results should be correlated with clinical presentation. Performed at [facility], [place] [address], [place],  [address]   Procalcitonin     Status: None   Collection Time: 12/05/19  8:15 PM  Result Value Ref Range   Procalcitonin 0.76 ng/mL    Comment:        Interpretation: PCT > 0.5 ng/mL and <= 2 ng/mL: Systemic infection (sepsis) is possible, but other conditions are known to elevate PCT as well. (NOTE)       Sepsis PCT Algorithm           Lower Respiratory Tract                                      Infection PCT Algorithm    ----------------------------     ----------------------------  PCT < 0.25 ng/mL                PCT < 0.10 ng/mL         Strongly encourage             Strongly discourage   discontinuation of antibiotics    initiation of antibiotics    ----------------------------     -----------------------------       PCT 0.25 - 0.50 ng/mL            PCT 0.10 - 0.25 ng/mL               OR       >80% decrease in PCT            Discourage initiation of                                            antibiotics      Encourage discontinuation           of antibiotics    ----------------------------     -----------------------------         PCT >= 0.50 ng/mL              PCT 0.26 - 0.50 ng/mL                AND       <80% decrease in PCT             Encourage initiation of                                             antibiotics       Encourage continuation           of antibiotics    ----------------------------     -----------------------------        PCT >= 0.50 ng/mL                  PCT > 0.50 ng/mL               AND         increase in PCT                  Strongly encourage                                      initiation of antibiotics    Strongly encourage escalation           of antibiotics                                      -----------------------------                                           PCT <= 0.25 ng/mL  OR                                        > 80% decrease in PCT                                     Discontinue / Do not initiate                                             antibiotics Performed at [facility], [place] [address], [place], [place] [address]   Lactate dehydrogenase     Status: Abnormal   Collection Time: 12/05/19  8:15 PM  Result Value Ref Range   LDH 216 (H) 98 - 192 U/L    Comment: Performed at [facility] [address] [place], [place] [address]  Ferritin     Status: Abnormal   Collection Time: 12/05/19  8:15 PM  Result Value Ref Range   Ferritin 608 (H) 24 - 336 ng/mL    Comment: Performed at [facility] [address] [place], [place] [address]  Fibrinogen     Status: Abnormal   Collection Time: 12/05/19  8:15 PM  Result Value Ref Range   Fibrinogen >800 (H) 210 - 475 mg/dL    Comment: Performed at [facility] [address] [place], [place] [address]  C-reactive protein     Status: Abnormal   Collection Time: 12/05/19  8:15 PM  Result Value Ref Range   CRP 32.7 (H) <1.0 mg/dL    Comment: Performed at [facility] [address], [place], [place] [address]  Brain natriuretic peptide     Status: None   Collection Time: 12/05/19  8:15 PM  Result Value Ref Range   B Natriuretic Peptide 76.2 0.0 - 100.0 pg/mL    Comment: Performed at [facility] [address], [place], [place] [address]  Triglycerides     Status: None   Collection Time: 12/05/19  8:15 PM  Result Value Ref Range   Triglycerides 77 <150 mg/dL    Comment: Performed at [facility] [address] [place], [place] [address]    Chemistries  Recent Labs  Lab 12/05/19 1857  NA 127*  K 4.1  CL 93*  CO2 20*  GLUCOSE 157*  BUN 14  CREATININE 0.95  CALCIUM 8.7*  AST 34  ALT 41  ALKPHOS 128*  BILITOT  1.9*   ------------------------------------------------------------------------------------------------------------------  ------------------------------------------------------------------------------------------------------------------ GFR: Estimated Creatinine Clearance: 89 mL/min (by C-G formula based on SCr of 0.95 mg/dL). Liver Function Tests: Recent Labs  Lab 12/05/19 1857  AST 34  ALT 41  ALKPHOS 128*  BILITOT 1.9*  PROT 7.4  ALBUMIN 3.3*   No results for input(s): LIPASE, AMYLASE in the last 168 hours. No results for input(s): AMMONIA in the last 168 hours. Coagulation Profile: No results for input(s): INR, PROTIME in the last 168 hours. Cardiac Enzymes: No results for input(s): CKTOTAL, CKMB, CKMBINDEX, TROPONINI in the last 168 hours. BNP (last 3 results) No results for input(s): PROBNP in the last 8760 hours. HbA1C: No results for input(s): HGBA1C in the last 72 hours. CBG: No  results for input(s): GLUCAP in the last 168 hours. Lipid Profile: Recent Labs    12/05/19 2015  TRIG 77   Thyroid Function Tests: No results for input(s): TSH, T4TOTAL, FREET4, T3FREE, THYROIDAB in the last 72 hours. Anemia Panel: Recent Labs    12/05/19 2015  FERRITIN 608*    --------------------------------------------------------------------------------------------------------------- Urine analysis:    Component Value Date/Time   COLORURINE AMBER (A) 12/05/2019 2015   APPEARANCEUR HAZY (A) 12/05/2019 2015   LABSPEC 1.032 (H) 12/05/2019 2015   PHURINE 5.0 12/05/2019 2015   GLUCOSEU NEGATIVE 12/05/2019 2015   HGBUR LARGE (A) 12/05/2019 2015   BILIRUBINUR NEGATIVE 12/05/2019 2015   [place] 12/05/2019 2015   PROTEINUR 100 (A) 12/05/2019 2015   NITRITE NEGATIVE 12/05/2019 2015   LEUKOCYTESUR NEGATIVE 12/05/2019 2015      Imaging Results:    DG Chest Portable 1 View  Result Date: 12/05/2019 CLINICAL DATA:  Shortness of breath, COVID-19 positive on Tuesday,  increasing shortness of breath the past few days. Smoker. EXAM: PORTABLE CHEST 1 VIEW COMPARISON:  Radiograph February 10, 2016 FINDINGS: Some mixed interstitial airspace opacities with basilar and peripheral predominance. Low lung volumes likely some superimposed atelectatic changes as well. Prominence of the cardiomediastinal silhouette is likely accentuated by low volumes and portable technique. Remaining cardiomediastinal contours are unremarkable. No acute osseous or soft tissue abnormality. Degenerative changes are present in the imaged spine and shoulders. Telemetry leads overlie the chest. IMPRESSION: Some mixed interstitial and airspace opacities with basilar and peripheral predominance. Findings are compatible with acute infection in the setting of COVID-19 positivity though edema could have a similar appearance. Electronically Signed   By: Lovena Le M.D.   On: 12/05/2019 18:58      Assessment & Plan:    Principal Problem:   Acute respiratory failure with hypoxia (HCC) Active Problems:   COVID-19 virus infection   Hyponatremia   Tachycardia  Acute respiratory failure with hypoxia, secondary to covid-19 infection Avoid NSAIDS Dexamethasone 51m iv qday Remdesvir pharmacy consult  Asthma Cont Symbicort-> Dulera 2puff bid Cont Albuterol HFA  Prn   Tachycardia Check TSH Check CTA chest r/o PE Consider cardiac echo if persistent  Hyponatremia Hydrate with ns iv Check cmp in am  Hypertension Cont Lisinopril 221mpo qday  Gerd Cont Pepcid    DVT Prophylaxis-   Lovenox - SCDs  AM Labs Ordered, also please review Full Orders  Family Communication: Admission, patients condition and plan of care including tests being ordered have been discussed with the patient  who indicate understanding and agree with the plan and Code Status.  Code Status:  FULL CODE per patient, [facility] Based on patients clinical presentation and  evaluation of above clinical data, I have made determination that patient meets Inpatient criteria at this time. Pt has Acute respiratory failure with hypoxia requiring 6L [place],  Pt has high risk of clincal deterioration.  Pt will require iv dexamethasone as well as remdesivir.  Pt will require > 2 nites stay.   Time spent in minutes : 70 minutes   JaJani Gravel.D on 12/06/2019 at 1:42 AM

## 2019-12-05 NOTE — ED Notes (Signed)
Critical lactic 3.1 reported to Dr.rees

## 2019-12-05 NOTE — Progress Notes (Signed)
Pharmacy Consult - Remdesivir  41 yom presenting COVID-19 positive with respiratory symptoms requiring hospitalization. Pharmacy consulted to dose Remdesivir. ALT<220.   Plan: Remdesivir 200mg  IV x 1; then 100mg  IV q24h to complete 5 total doses Monitor clinical progress, ALT  , PharmD, BCCCP Clinical Pharmacist  Phone: [phone]  Please check AMION for all [facility] Pharmacy phone numbers After 10:00 PM, call Main Pharmacy [phone] 12/05/2019 9:52 PM

## 2019-12-06 ENCOUNTER — Inpatient Hospital Stay (HOSPITAL_COMMUNITY): Payer: PPO

## 2019-12-06 DIAGNOSIS — J45902 Unspecified asthma with status asthmaticus: Secondary | ICD-10-CM

## 2019-12-06 DIAGNOSIS — I10 Essential (primary) hypertension: Secondary | ICD-10-CM

## 2019-12-06 DIAGNOSIS — J1282 Pneumonia due to coronavirus disease 2019: Secondary | ICD-10-CM

## 2019-12-06 DIAGNOSIS — K219 Gastro-esophageal reflux disease without esophagitis: Secondary | ICD-10-CM

## 2019-12-06 DIAGNOSIS — U071 COVID-19: Secondary | ICD-10-CM

## 2019-12-06 LAB — COMPREHENSIVE METABOLIC PANEL
ALT: 37 U/L (ref 0–44)
AST: 30 U/L (ref 15–41)
Albumin: 3.1 g/dL — ABNORMAL LOW (ref 3.5–5.0)
Alkaline Phosphatase: 120 U/L (ref 38–126)
Anion gap: 13 (ref 5–15)
BUN: 13 mg/dL (ref 8–23)
CO2: 24 mmol/L (ref 22–32)
Calcium: 8.6 mg/dL — ABNORMAL LOW (ref 8.9–10.3)
Chloride: 94 mmol/L — ABNORMAL LOW (ref 98–111)
Creatinine, Ser: 0.92 mg/dL (ref 0.61–1.24)
GFR calc Af Amer: >60 mL/min (ref >60)
GFR calc non Af Amer: >60 mL/min (ref >60)
Glucose, Bld: 216 mg/dL — ABNORMAL HIGH (ref 70–99)
Potassium: 4.1 mmol/L (ref 3.5–5.1)
Sodium: 131 mmol/L — ABNORMAL LOW (ref 135–145)
Total Bilirubin: 1.2 mg/dL (ref 0.3–1.2)
Total Protein: 6.9 g/dL (ref 6.5–8.1)

## 2019-12-06 LAB — FIBRINOGEN: Fibrinogen: >800 mg/dL — ABNORMAL HIGH (ref 210–475)

## 2019-12-06 LAB — CBC WITH DIFFERENTIAL/PLATELET
Abs Immature Granulocytes: 0.12 10*3/uL — ABNORMAL HIGH (ref 0.00–0.07)
Basophils Absolute: 0 10*3/uL (ref 0.0–0.1)
Basophils Relative: 0 %
Eosinophils Absolute: 0 10*3/uL (ref 0.0–0.5)
Eosinophils Relative: 0 %
HCT: 43.8 % (ref 39.0–52.0)
Hemoglobin: 14.4 g/dL (ref 13.0–17.0)
Immature Granulocytes: 1 %
Lymphocytes Relative: 4 %
Lymphs Abs: 0.6 10*3/uL — ABNORMAL LOW (ref 0.7–4.0)
MCH: 29.7 pg (ref 26.0–34.0)
MCHC: 32.9 g/dL (ref 30.0–36.0)
MCV: 90.3 fL (ref 80.0–100.0)
Monocytes Absolute: 0.5 10*3/uL (ref 0.1–1.0)
Monocytes Relative: 3 %
Neutro Abs: 14.8 10*3/uL — ABNORMAL HIGH (ref 1.7–7.7)
Neutrophils Relative %: 92 %
Platelets: 239 10*3/uL (ref 150–400)
RBC: 4.85 MIL/uL (ref 4.22–5.81)
RDW: 13.6 % (ref 11.5–15.5)
WBC: 16 10*3/uL — ABNORMAL HIGH (ref 4.0–10.5)
nRBC: 0 % (ref 0.0–0.2)

## 2019-12-06 LAB — TROPONIN I (HIGH SENSITIVITY): Troponin I (High Sensitivity): 10 ng/L (ref <18)

## 2019-12-06 LAB — ABO/RH: ABO/RH(D): O POS

## 2019-12-06 MED ORDER — TOCILIZUMAB 400 MG/20ML IV SOLN
800.0000 mg | Freq: Once | INTRAVENOUS | Status: AC
  Administered 2019-12-06: 16:00:00 800 mg via INTRAVENOUS
  Filled 2019-12-06: qty 40

## 2019-12-06 MED ORDER — ASPIRIN EC 81 MG PO TBEC
81.0000 mg | DELAYED_RELEASE_TABLET | Freq: Every day | ORAL | Status: DC
  Administered 2019-12-06 – 2019-12-14 (×9): 81 mg via ORAL
  Filled 2019-12-06 (×10): qty 1

## 2019-12-06 MED ORDER — GUAIFENESIN-DM 100-10 MG/5ML PO SYRP
10.0000 mL | ORAL_SOLUTION | ORAL | Status: DC | PRN
  Administered 2019-12-10: 11:00:00 10 mL via ORAL
  Filled 2019-12-06: qty 10

## 2019-12-06 MED ORDER — HYDROCOD POLST-CPM POLST ER 10-8 MG/5ML PO SUER
5.0000 mL | Freq: Two times a day (BID) | ORAL | Status: DC
  Administered 2019-12-06 – 2019-12-14 (×17): 5 mL via ORAL
  Filled 2019-12-06 (×17): qty 5

## 2019-12-06 MED ORDER — ENOXAPARIN SODIUM 60 MG/0.6ML ~~LOC~~ SOLN: 60.0000 mg | SUBCUTANEOUS | Status: DC

## 2019-12-06 MED ORDER — FAMOTIDINE 20 MG PO TABS
20.0000 mg | ORAL_TABLET | Freq: Two times a day (BID) | ORAL | Status: DC
  Administered 2019-12-06 – 2019-12-14 (×17): 20 mg via ORAL
  Filled 2019-12-06 (×19): qty 1

## 2019-12-06 MED ORDER — IOHEXOL 350 MG/ML SOLN
100.0000 mL | Freq: Once | INTRAVENOUS | Status: AC | PRN
  Administered 2019-12-06: 07:00:00 100 mL via INTRAVENOUS

## 2019-12-06 MED ORDER — FLUTICASONE PROPIONATE 50 MCG/ACT NA SUSP
1.0000 | Freq: Every day | NASAL | Status: DC
  Administered 2019-12-06: 11:00:00 1 via NASAL
  Administered 2019-12-07: 2 via NASAL
  Administered 2019-12-08: 09:00:00 1 via NASAL
  Administered 2019-12-09 – 2019-12-14 (×6): 2 via NASAL
  Filled 2019-12-06: qty 16

## 2019-12-06 MED ORDER — ALBUTEROL SULFATE HFA 108 (90 BASE) MCG/ACT IN AERS
1.0000 | INHALATION_SPRAY | Freq: Four times a day (QID) | RESPIRATORY_TRACT | Status: DC | PRN
  Administered 2019-12-06 – 2019-12-08 (×2): 2 via RESPIRATORY_TRACT
  Filled 2019-12-06: qty 6.7

## 2019-12-06 MED ORDER — SODIUM CHLORIDE 0.9% IV SOLUTION: Freq: Once | INTRAVENOUS | Status: AC

## 2019-12-06 MED ORDER — LISINOPRIL 20 MG PO TABS: 20.0000 mg | ORAL_TABLET | Freq: Every day | ORAL | Status: DC

## 2019-12-06 MED ORDER — MOMETASONE FURO-FORMOTEROL FUM 200-5 MCG/ACT IN AERO
2.0000 | INHALATION_SPRAY | Freq: Two times a day (BID) | RESPIRATORY_TRACT | Status: DC
  Administered 2019-12-06 – 2019-12-14 (×17): 2 via RESPIRATORY_TRACT
  Filled 2019-12-06: qty 8.8

## 2019-12-06 MED ORDER — LOSARTAN POTASSIUM 25 MG PO TABS
50.0000 mg | ORAL_TABLET | Freq: Every day | ORAL | Status: DC
  Administered 2019-12-06 – 2019-12-07 (×2): 50 mg via ORAL
  Filled 2019-12-06 (×2): qty 2

## 2019-12-06 NOTE — Progress Notes (Addendum)
PROGRESS NOTE    Manuel Castillo.  ZSW:[id number] DOB: 17-Aug-1946 DOA: 12/05/2019 PCP: [facility]    Brief Narrative:  74 year old male who presented with dyspnea.  He does have significant past medical history for hypertension, and asthma.  Patient reported 4 days of dyspnea, dry cough and subjective fevers.  On his initial physical examination his temperature was 100.7 F, heart rate 99, respiratory rate 15, blood pressure 134/75, oximetry 90% on room air.  His lungs had Rales at bases bilaterally, heart S1-S2 present, rhythmic, soft abdomen, no lower extremity edema.  Sodium 127, potassium 4.1, chloride 93, bicarb 20, glucose 157, BUN 14, creatinine 0.95, white count 17.4, hemoglobin 15.8, hematocrit 49.5, platelets 235.  Urinalysis specific gravity 1.032, 100 protein, 21-50 red cells, 6-10 white cells. Chest radiograph with bilateral interstitial infiltrates at bases, CT chest negative for pulmonary embolism, bilateral groundglass opacities.  Patient was admitted to the hospital with working diagnosis of acute hypoxic respiratory failure due to SARS COVID-19 viral pneumonia.  Assessment & Plan:   Principal Problem:   Pneumonia due to COVID-19 virus Active Problems:   Acute respiratory failure with hypoxia ([place])   COVID-19 virus infection   Hyponatremia   Essential hypertension   GERD (gastroesophageal reflux disease)   1.  Acute hypoxic respiratory failure due to SARS COVID-19 viral pneumonia.  RR: 26  Pulse oxymetry: 87 to 94%  Fi02: 4 L/ min per [place]  COVID-19 Labs  Recent Labs    12/05/19 2015  DDIMER 2.93*  FERRITIN 608*  LDH 216*  CRP 32.7*    No results found for: [place]  Patient with increased work of breathing and increased oxygen requirements, now up to 10 L/ min per [place], very elevated inflammatory markers and bilateral infiltrates on chest film.  In the setting of worsening hypoxic respiratory failure, I explained the  patient in detail, the use of advanced therapies: Tocilizumab and COVID 19 convalescent plasma.  I informed him the risks of this interventions including immunosuppression, opportunistic infections, gastrointestinal complications, allergic reactions, volume overload and others.  I explained him the experimental nature of this interventions, in the face of current COVID 19 pandemic, along with the potential benefits of improvement of hypoxic respiratory failure and systemic inflammation.  He has agreed to proceed and consent was signed.  Will continue medical therapy with Remdesivir #2/5 and systemic corticosteroids with dexamethasone. Add antitussive agents, continue bronchodilators and airway clearing techniques with flutter valve and incentive spirometer.   Continue close oxymetry monitoring. Patient is at a high risk for worsening respiratory failure.   2. HTN. Will continue blood pressure control with losartab (home med), continue blood pressure monitoring.  3. Hyponatremia.  Serum Na at 132 this am, renal function with serum cr at 0,92 and K at 4,1 with serum bicarbonate at 24. Will continue to hold on IV fluids for now, encourage po intake, will follow on renal function in am.   4. Asthma. No signs of clinical exacerbation, will continue bronchodilator therapy, dulera.   5. GERD. Continue antiacid therapy with famotidine.   DVT prophylaxis: enoxaparin   Code Status:  full Family Communication: no family at the bedside  Disposition Plan/ discharge barriers: pending clinical improvement and completion of antiviral therapy.       Subjective: Patient this am with significant dyspnea, increased work of breathing, very weak and deconditioned. No nausea or vomiting. Poor energy and fatigued.   Objective: Vitals:   12/05/19 2315 12/06/19 0100 12/06/19 0517 12/06/19  0743  BP: (!) 143/66 (!) 142/61 (!) 132/55 (!) 162/72  Pulse: 85 87 80 87  Resp: (!) 26 20 (!) 26 (!) 26  Temp:  98.2 F  (36.8 C) 99.1 F (37.3 C) (!) 97.4 F (36.3 C)  TempSrc:  Oral Oral Oral  SpO2: (!) 89% 92% 94% (!) 87%  Weight:  124.7 kg    Height:  5\' 8"  (1.727 m)      Intake/Output Summary (Last 24 hours) at 12/06/2019 0820 Last data filed at 12/06/2019 0700 Gross per 24 hour  Intake 800 ml  Output 500 ml  Net 300 ml   Filed Weights   12/06/19 0100  Weight: 124.7 kg    Examination:   General: deconditioned and ill looking appearing.  Neurology: Awake and alert, non focal  E ENT: mild pallor, no icterus, oral mucosa moist Cardiovascular: No JVD. S1-S2 present, rhythmic. No lower extremity edema. Pulmonary: positive breath sounds bilaterally.  Gastrointestinal. Abdomen with no organomegaly, non tender, no rebound or guarding Skin. No rashes Musculoskeletal: no joint deformities     Data Reviewed: I have personally reviewed following labs and imaging studies  CBC: Recent Labs  Lab 12/05/19 1857 12/06/19 0258  WBC 17.4* 16.0*  NEUTROABS 14.8* 14.8*  HGB 15.8 14.4  HCT 49.5 43.8  MCV 93.0 90.3  PLT 235 239   Basic Metabolic Panel: Recent Labs  Lab 12/05/19 1857 12/06/19 0258  NA 127* 131*  K 4.1 4.1  CL 93* 94*  CO2 20* 24  GLUCOSE 157* 216*  BUN 14 13  CREATININE 0.95 0.92  CALCIUM 8.7* 8.6*   GFR: Estimated Creatinine Clearance: 91.9 mL/min (by C-G formula based on SCr of 0.92 mg/dL). Liver Function Tests: Recent Labs  Lab 12/05/19 1857 12/06/19 0258  AST 34 30  ALT 41 37  ALKPHOS 128* 120  BILITOT 1.9* 1.2  PROT 7.4 6.9  ALBUMIN 3.3* 3.1*   No results for input(s): LIPASE, AMYLASE in the last 168 hours. No results for input(s): AMMONIA in the last 168 hours. Coagulation Profile: No results for input(s): INR, PROTIME in the last 168 hours. Cardiac Enzymes: No results for input(s): CKTOTAL, CKMB, CKMBINDEX, TROPONINI in the last 168 hours. BNP (last 3 results) No results for input(s): PROBNP in the last 8760 hours. HbA1C: No results for input(s):  HGBA1C in the last 72 hours. CBG: No results for input(s): GLUCAP in the last 168 hours. Lipid Profile: Recent Labs    12/05/19 2015  TRIG 77   Thyroid Function Tests: No results for input(s): TSH, T4TOTAL, FREET4, T3FREE, THYROIDAB in the last 72 hours. Anemia Panel: Recent Labs    12/05/19 2015  FERRITIN 608*      Radiology Studies: I have reviewed all of the imaging during this hospital visit personally     Scheduled Meds: . aspirin EC  81 mg Oral Daily  . dexamethasone (DECADRON) injection  6 mg Intravenous Q24H  . enoxaparin (LOVENOX) injection  40 mg Subcutaneous Q24H  . famotidine  20 mg Oral BID  . fluticasone  1-2 spray Each Nare Daily  . lisinopril  20 mg Oral Daily  . mometasone-formoterol  2 puff Inhalation BID  . sodium chloride flush  3 mL Intravenous Once   Continuous Infusions: . remdesivir 100 mg in NS 100 mL       LOS: 1 day        Ronold Hardgrove 12/07/19, MD

## 2019-12-07 DIAGNOSIS — J1282 Pneumonia due to coronavirus disease 2019: Secondary | ICD-10-CM

## 2019-12-07 LAB — HEMOGLOBIN A1C
Hgb A1c MFr Bld: 7.2 % — ABNORMAL HIGH (ref 4.8–5.6)
Mean Plasma Glucose: 159.94 mg/dL

## 2019-12-07 LAB — COMPREHENSIVE METABOLIC PANEL
ALT: 41 U/L (ref 0–44)
AST: 45 U/L — ABNORMAL HIGH (ref 15–41)
Albumin: 3.1 g/dL — ABNORMAL LOW (ref 3.5–5.0)
Alkaline Phosphatase: 94 U/L (ref 38–126)
Anion gap: 11 (ref 5–15)
BUN: 27 mg/dL — ABNORMAL HIGH (ref 8–23)
CO2: 25 mmol/L (ref 22–32)
Calcium: 8.5 mg/dL — ABNORMAL LOW (ref 8.9–10.3)
Chloride: 93 mmol/L — ABNORMAL LOW (ref 98–111)
Creatinine, Ser: 0.84 mg/dL (ref 0.61–1.24)
GFR calc Af Amer: >60 mL/min (ref >60)
GFR calc non Af Amer: >60 mL/min (ref >60)
Glucose, Bld: 184 mg/dL — ABNORMAL HIGH (ref 70–99)
Potassium: 4.2 mmol/L (ref 3.5–5.1)
Sodium: 129 mmol/L — ABNORMAL LOW (ref 135–145)
Total Bilirubin: 0.7 mg/dL (ref 0.3–1.2)
Total Protein: 6.9 g/dL (ref 6.5–8.1)

## 2019-12-07 LAB — BPAM FFP
Blood Product Expiration Date: 202101181319
ISSUE DATE / TIME: 202101171341
Unit Type and Rh: 5100

## 2019-12-07 LAB — D-DIMER, QUANTITATIVE: D-Dimer, Quant: 2.93 ug/mL-FEU — ABNORMAL HIGH (ref 0.00–0.50)

## 2019-12-07 LAB — C-REACTIVE PROTEIN: CRP: 26 mg/dL — ABNORMAL HIGH (ref <1.0)

## 2019-12-07 LAB — PREPARE FRESH FROZEN PLASMA

## 2019-12-07 LAB — FERRITIN: Ferritin: 876 ng/mL — ABNORMAL HIGH (ref 24–336)

## 2019-12-07 LAB — GLUCOSE, CAPILLARY: Glucose-Capillary: 237 mg/dL — ABNORMAL HIGH (ref 70–99)

## 2019-12-07 MED ORDER — INSULIN ASPART 100 UNIT/ML ~~LOC~~ SOLN
0.0000 [IU] | Freq: Three times a day (TID) | SUBCUTANEOUS | Status: DC
  Administered 2019-12-08 – 2019-12-09 (×3): 2 [IU] via SUBCUTANEOUS
  Administered 2019-12-09 – 2019-12-10 (×2): 1 [IU] via SUBCUTANEOUS
  Administered 2019-12-10: 13:00:00 3 [IU] via SUBCUTANEOUS
  Administered 2019-12-11: 1 [IU] via SUBCUTANEOUS
  Administered 2019-12-11: 12:00:00 2 [IU] via SUBCUTANEOUS
  Administered 2019-12-11 – 2019-12-12 (×3): 1 [IU] via SUBCUTANEOUS
  Administered 2019-12-13 (×2): 2 [IU] via SUBCUTANEOUS

## 2019-12-07 NOTE — Progress Notes (Addendum)
PROGRESS NOTE    Manuel Castillo.  PXT:[id number] DOB: [birth year] DOA: 12/05/2019 PCP: [facility]    Brief Narrative:  74 year old male who presented with dyspnea.  He does have significant past medical history for hypertension, and asthma.  Patient reported 4 days of dyspnea, dry cough and subjective fevers.  On his initial physical examination his temperature was 100.7 F, heart rate 99, respiratory rate 15, blood pressure 134/75, oximetry 90% on room air.  His lungs had Rales at bases bilaterally, heart S1-S2 present, rhythmic, soft abdomen, no lower extremity edema.  Sodium 127, potassium 4.1, chloride 93, bicarb 20, glucose 157, BUN 14, creatinine 0.95, white count 17.4, hemoglobin 15.8, hematocrit 49.5, platelets 235.  Urinalysis specific gravity 1.032, 100 protein, 21-50 red cells, 6-10 white cells. Chest radiograph with bilateral interstitial infiltrates at bases, CT chest negative for pulmonary embolism, bilateral groundglass opacities.  Patient was admitted to the hospital with working diagnosis of acute hypoxic respiratory failure due to SARS COVID-19 viral pneumonia.  Patient with worsening respiratory failure with bilateral pulmonary infiltrates and very high inflammatory markers, received Tocilizumab and COVID 19 convalescent plasma on 12/06/19.    Assessment & Plan:   Principal Problem:   Pneumonia due to COVID-19 virus Active Problems:   Acute respiratory failure with hypoxia (HCC)   COVID-19 virus infection   Hyponatremia   Essential hypertension   GERD (gastroesophageal reflux disease)   1.  Acute hypoxic respiratory failure due to SARS COVID-19 viral pneumonia. Sp Tocilizumab 01/17, COVID 19 convalescent plasma 01/17.   RR: 20 to 24 Pulse oxymetry: 89 to 90  Fi02: 5 L/ min per [place]  COVID-19 Labs  Recent Labs    12/05/19 2015 12/07/19 0311  DDIMER 2.93* 2.93*  FERRITIN 608* 876*  LDH 216*  --   CRP 32.7* 26.0*    No  results found for: SARSCOV2NAA   Inflammatory markers continue to be elevated, dyspnea and oxygen requirements have improved.  Tolerating well medical therapy with Remdesivir #3/5 (AST 45 and ALT 41) and systemic corticosteroids (dexamethasone). Continue antitussive agents, bronchodilators and airway clearing techniques with flutter valve and incentive spirometer.   Out of bed to chair tid with meals, physical and occupational therapy evaluations.   2. HTN. Blood pressure 96/85, will hold on losartan for now, to prevent hypotension. Continue blood pressure monitoring.   3. Hyponatremia.  Serum Na at 129 this am, serum cr at 0,84 and K at 4,2 with serum bicarbonate at 25. Continue to encourage po intake, follow on renal panel in am.   4. Asthma. No clinical signs of exacerbation will continue bronchodilator therapy, dulera.   5. GERD.  On antiacid with famotidine.   DVT prophylaxis: enoxaparin   Code Status:  full Family Communication: I spoke over the phone with the patient's daughter and wife about patient's  condition, plan of care, prognosis and all questions were addressed. Disposition Plan/ discharge barriers: pending clinical improvement and completion of antiviral therapy.   Subjective: Patient tolerated well plasma and actemra, his dyspnea has improved but not yet back to baseline, no nausea or vomiting, no chest pain. Poor appetite and generalized weakness.   Objective: Vitals:   12/06/19 1645 12/06/19 2006 12/07/19 0000 12/07/19 0400  BP: 132/64 (!) 142/68 (!) 123/57 (!) 121/54  Pulse: 78 72 62 (!) 57  Resp: (!) 21 (!) 22 (!) 24 20  Temp: 98.4 F (36.9 C) 98.3 F (36.8 C) 98 F (36.7 C) 97.9 F (36.6 C)  TempSrc: Oral Oral Oral Oral  SpO2:  90% 90% (!) 89%  Weight:      Height:        Intake/Output Summary (Last 24 hours) at 12/07/2019 0849 Last data filed at 12/07/2019 0500 Gross per 24 hour  Intake 1248.82 ml  Output --  Net 1248.82 ml   Filed Weights    12/06/19 0100  Weight: 124.7 kg    Examination:   General: deconditioned  Neurology: Awake and alert, non focal  E ENT: mild pallor, no icterus, oral mucosa moist Cardiovascular: No JVD. S1-S2 present, rhythmic. No lower extremity edema. Pulmonary: positive breath sounds bilaterally. Gastrointestinal. Abdomen with no organomegaly, non tender, no rebound or guarding Skin. No rashes Musculoskeletal: no joint deformities     Data Reviewed: I have personally reviewed following labs and imaging studies  CBC: Recent Labs  Lab 12/05/19 1857 12/06/19 0258  WBC 17.4* 16.0*  NEUTROABS 14.8* 14.8*  HGB 15.8 14.4  HCT 49.5 43.8  MCV 93.0 90.3  PLT 235 622   Basic Metabolic Panel: Recent Labs  Lab 12/05/19 1857 12/06/19 0258 12/07/19 0311  NA 127* 131* 129*  K 4.1 4.1 4.2  CL 93* 94* 93*  CO2 20* 24 25  GLUCOSE 157* 216* 184*  BUN 14 13 27*  CREATININE 0.95 0.92 0.84  CALCIUM 8.7* 8.6* 8.5*   GFR: Estimated Creatinine Clearance: 100.7 mL/min (by C-G formula based on SCr of 0.84 mg/dL). Liver Function Tests: Recent Labs  Lab 12/05/19 1857 12/06/19 0258 12/07/19 0311  AST 34 30 45*  ALT 41 37 41  ALKPHOS 128* 120 94  BILITOT 1.9* 1.2 0.7  PROT 7.4 6.9 6.9  ALBUMIN 3.3* 3.1* 3.1*   No results for input(s): LIPASE, AMYLASE in the last 168 hours. No results for input(s): AMMONIA in the last 168 hours. Coagulation Profile: No results for input(s): INR, PROTIME in the last 168 hours. Cardiac Enzymes: No results for input(s): CKTOTAL, CKMB, CKMBINDEX, TROPONINI in the last 168 hours. BNP (last 3 results) No results for input(s): PROBNP in the last 8760 hours. HbA1C: No results for input(s): HGBA1C in the last 72 hours. CBG: No results for input(s): GLUCAP in the last 168 hours. Lipid Profile: Recent Labs    12/05/19 2015  TRIG 77   Thyroid Function Tests: No results for input(s): TSH, T4TOTAL, FREET4, T3FREE, THYROIDAB in the last 72 hours. Anemia  Panel: Recent Labs    12/05/19 2015 12/07/19 0311  FERRITIN 608* 876*      Radiology Studies: I have reviewed all of the imaging during this hospital visit personally     Scheduled Meds: . aspirin EC  81 mg Oral Daily  . chlorpheniramine-HYDROcodone  5 mL Oral Q12H  . dexamethasone (DECADRON) injection  6 mg Intravenous Q24H  . famotidine  20 mg Oral BID  . fluticasone  1-2 spray Each Nare Daily  . losartan  50 mg Oral Daily  . mometasone-formoterol  2 puff Inhalation BID  . sodium chloride flush  3 mL Intravenous Once   Continuous Infusions: . remdesivir 100 mg in NS 100 mL 100 mg (12/06/19 1106)     LOS: 2 days        Winfred Redel Gerome Apley, MD

## 2019-12-08 ENCOUNTER — Inpatient Hospital Stay (HOSPITAL_COMMUNITY): Payer: PPO

## 2019-12-08 LAB — COMPREHENSIVE METABOLIC PANEL
ALT: 43 U/L (ref 0–44)
AST: 40 U/L (ref 15–41)
Albumin: 2.9 g/dL — ABNORMAL LOW (ref 3.5–5.0)
Alkaline Phosphatase: 90 U/L (ref 38–126)
Anion gap: 11 (ref 5–15)
BUN: 26 mg/dL — ABNORMAL HIGH (ref 8–23)
CO2: 28 mmol/L (ref 22–32)
Calcium: 8.5 mg/dL — ABNORMAL LOW (ref 8.9–10.3)
Chloride: 96 mmol/L — ABNORMAL LOW (ref 98–111)
Creatinine, Ser: 0.72 mg/dL (ref 0.61–1.24)
GFR calc Af Amer: >60 mL/min (ref >60)
GFR calc non Af Amer: >60 mL/min (ref >60)
Glucose, Bld: 168 mg/dL — ABNORMAL HIGH (ref 70–99)
Potassium: 4.7 mmol/L (ref 3.5–5.1)
Sodium: 135 mmol/L (ref 135–145)
Total Bilirubin: 0.6 mg/dL (ref 0.3–1.2)
Total Protein: 6.5 g/dL (ref 6.5–8.1)

## 2019-12-08 LAB — GLUCOSE, CAPILLARY
Glucose-Capillary: 173 mg/dL — ABNORMAL HIGH (ref 70–99)
Glucose-Capillary: 149 mg/dL — ABNORMAL HIGH (ref 70–99)
Glucose-Capillary: 144 mg/dL — ABNORMAL HIGH (ref 70–99)

## 2019-12-08 LAB — C-REACTIVE PROTEIN: CRP: 16.8 mg/dL — ABNORMAL HIGH (ref <1.0)

## 2019-12-08 LAB — D-DIMER, QUANTITATIVE: D-Dimer, Quant: 1.58 ug/mL-FEU — ABNORMAL HIGH (ref 0.00–0.50)

## 2019-12-08 LAB — FERRITIN: Ferritin: 1336 ng/mL — ABNORMAL HIGH (ref 24–336)

## 2019-12-08 MED ORDER — ENOXAPARIN SODIUM 60 MG/0.6ML ~~LOC~~ SOLN
0.5000 mg/kg | SUBCUTANEOUS | Status: DC
  Administered 2019-12-08 – 2019-12-09 (×2): 60 mg via SUBCUTANEOUS
  Filled 2019-12-08 (×2): qty 0.6

## 2019-12-08 MED ORDER — ALPRAZOLAM 0.5 MG PO TABS
0.5000 mg | ORAL_TABLET | Freq: Three times a day (TID) | ORAL | Status: DC | PRN
  Administered 2019-12-08 – 2019-12-10 (×3): 0.5 mg via ORAL
  Filled 2019-12-08 (×3): qty 1

## 2019-12-08 MED ORDER — FUROSEMIDE 10 MG/ML IJ SOLN
40.0000 mg | Freq: Once | INTRAMUSCULAR | Status: AC
  Administered 2019-12-08: 40 mg via INTRAVENOUS
  Filled 2019-12-08: qty 4

## 2019-12-08 NOTE — Progress Notes (Signed)
PROGRESS NOTE    Manuel Castillo.  DUK:[id number] DOB: 1946/06/26 DOA: 12/05/2019 PCP: [facility]    Brief Narrative:  74 year old male who presented with dyspnea. He does have significant past medical history for hypertension, and asthma. Patient reported 4 days of dyspnea, dry cough and subjective fevers. On his initial physical examination his temperature was 100.7 F, heart rate 99, respiratory rate 15, blood pressure 134/75, oximetry 90% on room air.His lungs had Rales at bases bilaterally, heart S1-S2 present, rhythmic, soft abdomen, no lower extremity edema. Sodium 127, potassium 4.1, chloride 93, bicarb 20, glucose 157, BUN 14, creatinine 0.95, white count 17.4, hemoglobin 15.8, hematocrit 49.5, platelets 235. Urinalysis specific gravity 1.032, 100 protein, 21-50 red cells, 6-10 white cells. Chest radiograph with bilateral interstitial infiltrates at bases, CT chest negative for pulmonary embolism, bilateral groundglass opacities.  Patient was admitted to the hospital with working diagnosis of acute hypoxic respiratory failure due to SARS COVID-19 viral pneumonia.  Patient with worsening respiratory failure with bilateral pulmonary infiltrates and very high inflammatory markers, received Tocilizumab and COVID 19 convalescent plasma on 12/06/19 with improvement in oxygenation.  01/19 with non cardiogenic pulmonary edema, required IV diuresis with furosemide.   Assessment & Plan:   Principal Problem:   Pneumonia due to COVID-19 virus Active Problems:   Acute respiratory failure with hypoxia ([place])   COVID-19 virus infection   Hyponatremia   Essential hypertension   GERD (gastroesophageal reflux disease)   1.Acute hypoxic respiratory failure due to SARS COVID-19 viral pneumonia. Sp Tocilizumab 01/17, COVID 19 convalescent plasma 01/17.   RR: 18  Pulse oxymetry: 92%  Fi02: 15 L/ min per [facility]  COVID-19 Labs  Recent Labs     12/05/19 2015 12/07/19 0311 12/08/19 0130  DDIMER 2.93* 2.93* 1.58*  FERRITIN 608* 876* 1,336*  LDH 216*  --   --   CRP 32.7* 26.0* 16.8*    No results found for: SARSCOV2NAA   Inflammatory markers trending down.   Continue medical therapy with Remdesivir #4/5 (AST 40 and ALT 43) systemic corticosteroids (dexamethasone 6 mg q 24 H). On antitussive agents, bronchodilators and airway clearing techniques with flutter valve and incentive spirometer.   Patient with increased oxygen requirements up to 15 L/ min per [facility], follow up chest film with signs of acute pulmonary edema, likely non cardiogenic, will do a trial of furosemide IV and follow up response.   Patient with very high risk of worsening hypoxic respiratory failure.   2. HTN. Blood pressure 143/68 will continue to hold on losartan to prevent hypotension.   3. Hyponatremia. Serum Na at 135 this am, preserved renal function with serum cr at 0,72 and K at 4,7 with serum bicarbonate at 28. Follow on renal panel in am after diuresis.  4. Asthma. Nosigns of exacerbation, on bronchodilator therapy and dulera.   5. GERD.  Continue with antiacid  famotidine.  6. New anxiety. Will add low dose alprazolam.   DVT prophylaxis:enoxaparin Code Status:full Family Communication: Disposition Plan/ discharge barriers:pending clinical improvement and completion of antiviral therapy.   Subjective: Patient this am with positive anxiety, exertion triggered oxygen desaturation, in increased oxygen requirements. Reports worsening dyspnea last night and this am, no chest pain, no nausea or vomiting.   Objective: Vitals:   12/07/19 2100 12/07/19 2200 12/07/19 2356 12/08/19 0400  BP:   138/74 128/64  Pulse: 64 61 62 (!) 58  Resp:  20 (!) 21 18  Temp:   97.8 F (36.6 C)  97.6 F (36.4 C)  TempSrc:   Oral Oral  SpO2: (!) 88% (!) 89% 90% 92%  Weight:      Height:        Intake/Output Summary (Last 24 hours) at 12/08/2019  0841 Last data filed at 12/08/2019 0500 Gross per 24 hour  Intake 1460 ml  Output 2400 ml  Net -940 ml   Filed Weights   12/06/19 0100  Weight: 124.7 kg    Examination:   General: positive dyspnea at rest, deconditioned  Neurology: Awake and alert, non focal  E ENT: mild pallor, no icterus, oral mucosa moist Cardiovascular: No JVD. S1-S2 present, rhythmic. No lower extremity edema. Pulmonary: decreased breath sounds bilaterally, no wheezing, rhonchi or rales. Gastrointestinal. Abdomen with no organomegaly, non tender, no rebound or guarding Skin. No rashes Musculoskeletal: no joint deformities     Data Reviewed: I have personally reviewed following labs and imaging studies  CBC: Recent Labs  Lab 12/05/19 1857 12/06/19 0258  WBC 17.4* 16.0*  NEUTROABS 14.8* 14.8*  HGB 15.8 14.4  HCT 49.5 43.8  MCV 93.0 90.3  PLT 235 239   Basic Metabolic Panel: Recent Labs  Lab 12/05/19 1857 12/06/19 0258 12/07/19 0311 12/08/19 0130  NA 127* 131* 129* 135  K 4.1 4.1 4.2 4.7  CL 93* 94* 93* 96*  CO2 20* 24 25 28   GLUCOSE 157* 216* 184* 168*  BUN 14 13 27* 26*  CREATININE 0.95 0.92 0.84 0.72  CALCIUM 8.7* 8.6* 8.5* 8.5*   GFR: Estimated Creatinine Clearance: 105.7 mL/min (by C-G formula based on SCr of 0.72 mg/dL). Liver Function Tests: Recent Labs  Lab 12/05/19 1857 12/06/19 0258 12/07/19 0311 12/08/19 0130  AST 34 30 45* 40  ALT 41 37 41 43  ALKPHOS 128* 120 94 90  BILITOT 1.9* 1.2 0.7 0.6  PROT 7.4 6.9 6.9 6.5  ALBUMIN 3.3* 3.1* 3.1* 2.9*   No results for input(s): LIPASE, AMYLASE in the last 168 hours. No results for input(s): AMMONIA in the last 168 hours. Coagulation Profile: No results for input(s): INR, PROTIME in the last 168 hours. Cardiac Enzymes: No results for input(s): CKTOTAL, CKMB, CKMBINDEX, TROPONINI in the last 168 hours. BNP (last 3 results) No results for input(s): PROBNP in the last 8760 hours. HbA1C: Recent Labs    12/07/19 0311   HGBA1C 7.2*   CBG: Recent Labs  Lab 12/07/19 2135  GLUCAP 237*   Lipid Profile: Recent Labs    12/05/19 2015  TRIG 77   Thyroid Function Tests: No results for input(s): TSH, T4TOTAL, FREET4, T3FREE, THYROIDAB in the last 72 hours. Anemia Panel: Recent Labs    12/07/19 0311 12/08/19 0130  FERRITIN 876* 1,336*      Radiology Studies: I have reviewed all of the imaging during this hospital visit personally     Scheduled Meds: . aspirin EC  81 mg Oral Daily  . chlorpheniramine-HYDROcodone  5 mL Oral Q12H  . dexamethasone (DECADRON) injection  6 mg Intravenous Q24H  . famotidine  20 mg Oral BID  . fluticasone  1-2 spray Each Nare Daily  . insulin aspart  0-9 Units Subcutaneous TID WC  . mometasone-formoterol  2 puff Inhalation BID  . sodium chloride flush  3 mL Intravenous Once   Continuous Infusions: . remdesivir 100 mg in NS 100 mL 100 mg (12/07/19 1102)     LOS: 3 days        Talita Recht 12/09/19, MD

## 2019-12-09 LAB — CBC
HCT: 44.3 % (ref 39.0–52.0)
Hemoglobin: 14.2 g/dL (ref 13.0–17.0)
MCH: 29.2 pg (ref 26.0–34.0)
MCHC: 32.1 g/dL (ref 30.0–36.0)
MCV: 91 fL (ref 80.0–100.0)
Platelets: 320 10*3/uL (ref 150–400)
RBC: 4.87 MIL/uL (ref 4.22–5.81)
RDW: 13.5 % (ref 11.5–15.5)
WBC: 9.1 10*3/uL (ref 4.0–10.5)
nRBC: 0 % (ref 0.0–0.2)

## 2019-12-09 LAB — COMPREHENSIVE METABOLIC PANEL
ALT: 47 U/L — ABNORMAL HIGH (ref 0–44)
AST: 38 U/L (ref 15–41)
Albumin: 2.9 g/dL — ABNORMAL LOW (ref 3.5–5.0)
Alkaline Phosphatase: 74 U/L (ref 38–126)
Anion gap: 12 (ref 5–15)
BUN: 23 mg/dL (ref 8–23)
CO2: 27 mmol/L (ref 22–32)
Calcium: 8.1 mg/dL — ABNORMAL LOW (ref 8.9–10.3)
Chloride: 93 mmol/L — ABNORMAL LOW (ref 98–111)
Creatinine, Ser: 0.72 mg/dL (ref 0.61–1.24)
GFR calc Af Amer: >60 mL/min (ref >60)
GFR calc non Af Amer: >60 mL/min (ref >60)
Glucose, Bld: 164 mg/dL — ABNORMAL HIGH (ref 70–99)
Potassium: 5 mmol/L (ref 3.5–5.1)
Sodium: 132 mmol/L — ABNORMAL LOW (ref 135–145)
Total Bilirubin: 1 mg/dL (ref 0.3–1.2)
Total Protein: 6.3 g/dL — ABNORMAL LOW (ref 6.5–8.1)

## 2019-12-09 LAB — GLUCOSE, CAPILLARY
Glucose-Capillary: 137 mg/dL — ABNORMAL HIGH (ref 70–99)
Glucose-Capillary: 182 mg/dL — ABNORMAL HIGH (ref 70–99)
Glucose-Capillary: 113 mg/dL — ABNORMAL HIGH (ref 70–99)
Glucose-Capillary: 170 mg/dL — ABNORMAL HIGH (ref 70–99)

## 2019-12-09 LAB — FERRITIN: Ferritin: 1148 ng/mL — ABNORMAL HIGH (ref 24–336)

## 2019-12-09 LAB — D-DIMER, QUANTITATIVE: D-Dimer, Quant: 1.09 ug/mL-FEU — ABNORMAL HIGH (ref 0.00–0.50)

## 2019-12-09 LAB — C-REACTIVE PROTEIN: CRP: 7.8 mg/dL — ABNORMAL HIGH (ref <1.0)

## 2019-12-09 MED ORDER — ENOXAPARIN SODIUM 60 MG/0.6ML ~~LOC~~ SOLN
0.5000 mg/kg | Freq: Two times a day (BID) | SUBCUTANEOUS | Status: DC
  Administered 2019-12-10 – 2019-12-14 (×8): 60 mg via SUBCUTANEOUS
  Filled 2019-12-09 (×8): qty 0.6

## 2019-12-09 MED ORDER — FUROSEMIDE 10 MG/ML IJ SOLN
40.0000 mg | Freq: Once | INTRAMUSCULAR | Status: AC
  Administered 2019-12-09: 40 mg via INTRAVENOUS
  Filled 2019-12-09: qty 4

## 2019-12-09 NOTE — Progress Notes (Signed)
Spoke with patient's daughter, Georga Hacking, who is also an [employer] fundraiser. Discussed plan of care and increased 02 demand.

## 2019-12-09 NOTE — Progress Notes (Signed)
PROGRESS NOTE                                                                                                                                                                                                             Patient Demographics:    Manuel Castillo, is a 74 y.o. male, DOB - 1946/08/03, ZGY:[id number]  Outpatient Primary MD for the patient is [facility]   Admit date - 12/05/2019   LOS - 4  Chief Complaint  Patient presents with  . covid symptoms  . Shortness of Breath       Brief Narrative: Patient is a 74 y.o. male with PMHx of hypertension, asthma-who presented with 4-day history of shortness of breath, subjective fevers-he was found to have acute hypoxic respiratory failure secondary to COVID-19 pneumonia.  Patient was diagnosed with COVID-19 on 1/12 at his PCPs office   Subjective:   Seems frustrated that he still has to remain in the hospital-he was on 14 L of oxygen this morning.    Assessment  & Plan :   Acute hypoxic respiratory failure secondary to COVID-19 pneumonia: Remains severely hypoxic-on 14 L of oxygen this morning.  He otherwise appears very comfortable.  Plans are to continue with steroids-thankfully CRP is downtrending.  We will continue to monitor closely-if he develops any signs of respiratory distress- will be moved to the ICU.  CTA chest on 1/17 negative  for PE.  Fever: afebrile  O2 requirements:  SpO2: (!) 89 % O2 Flow Rate (L/min): 15 L/min   COVID-19 Labs: Recent Labs    12/07/19 0311 12/08/19 0130 12/09/19 0345  DDIMER 2.93* 1.58* 1.09*  FERRITIN 876* 1,336* 1,148*  CRP 26.0* 16.8* 7.8*       Component Value Date/Time   BNP 76.2 12/05/2019 2015    Recent Labs  Lab 12/05/19 2015  PROCALCITON 0.76    No results found for: SARSCOV2NAA   COVID-19 Medications: Steroids:1/16>> Remdesivir: 1/16>> 1/20 Actemra: 1/17 x 1  Other  medications: Diuretics:Euvolemic-Lasix 40 mg IV x1 to maintain negative balance. Antibiotics:Not needed as no evidence of bacterial infection  Prone/Incentive Spirometry: encouraged patient to lie prone for 3-4 hours at a time for a total of 16 hours a day, and to encourage incentive spirometry use 3-4/hour.  DVT Prophylaxis  :  Lovenox-switch to twice daily dosing  given severity of hypoxemia.  HTN: Blood pressure controlled-continue to hold losartan.  Hyponatremia: Appears to be mild-stable for close follow-up.  Asthma: No signs of exacerbation-continue bronchodilators  GERD: Continue Pepcid  Anxiety: Continue low-dose Xanax as needed.  Obesity: Estimated body mass index is 41.81 kg/m as calculated from the following:   Height as of this encounter: 5\' 8"  (1.727 m).   Weight as of this encounter: 124.7 kg.    Consults  :  None  Procedures  :  None  ABG: No results found for: PHART, PCO2ART, PO2ART, HCO3, TCO2, ACIDBASEDEF, O2SAT  Vent Settings: N/A  Condition - Extremely Guarded  Family Communication  :  Spouse updated over the phone  Code Status :  Full Code  Diet :  Diet Order            Diet Heart Room service appropriate? Yes; Fluid consistency: Thin  Diet effective now               Disposition Plan  :  Remain hospitalized-probably will require home health services on discharge  Barriers to discharge: Hypoxia requiring O2 supplementation  Antimicorbials  :    Anti-infectives (From admission, onward)   Start     Dose/Rate Route Frequency Ordered Stop   12/06/19 1000  remdesivir 100 mg in sodium chloride 0.9 % 100 mL IVPB     100 mg 200 mL/hr over 30 Minutes Intravenous Daily 12/05/19 2137 12/09/19 1145   12/05/19 2230  remdesivir 200 mg in sodium chloride 0.9% 250 mL IVPB     200 mg 580 mL/hr over 30 Minutes Intravenous Once 12/05/19 2137 12/06/19 0000      Inpatient Medications  Scheduled Meds: . aspirin EC  81 mg Oral Daily  .  chlorpheniramine-HYDROcodone  5 mL Oral Q12H  . dexamethasone (DECADRON) injection  6 mg Intravenous Q24H  . enoxaparin (LOVENOX) injection  0.5 mg/kg Subcutaneous Q24H  . famotidine  20 mg Oral BID  . fluticasone  1-2 spray Each Nare Daily  . insulin aspart  0-9 Units Subcutaneous TID WC  . mometasone-formoterol  2 puff Inhalation BID  . sodium chloride flush  3 mL Intravenous Once   Continuous Infusions: PRN Meds:.acetaminophen, albuterol, ALPRAZolam, guaiFENesin-dextromethorphan  Time Spent in minutes  35   See all Orders from today for further details  Oren Binet M.D on 12/09/2019 at 3:35 PM  To page go to [email or web address] - use universal password  [facility] -  Office  [phone]    Objective:   Vitals:   12/09/19 1140 12/09/19 1150 12/09/19 1200 12/09/19 1356  BP:   130/67 128/71  Pulse:   75 71  Resp:   (!) 28 20  Temp:   97.7 F (36.5 C)   TempSrc:   Oral   SpO2: 91% (!) 88% 90% (!) 89%  Weight:      Height:        Wt Readings from Last 3 Encounters:  12/06/19 124.7 kg     Intake/Output Summary (Last 24 hours) at 12/09/2019 1535 Last data filed at 12/08/2019 1900 Gross per 24 hour  Intake --  Output 900 ml  Net -900 ml     Physical Exam Gen Exam:Alert awake-not in any distress HEENT:atraumatic, normocephalic Chest: B/L clear to auscultation anteriorly CVS:S1S2 regular Abdomen:soft non tender, non distended Extremities:trace edema Neurology: Non focal Skin: no rash   Data Review:    CBC Recent Labs  Lab 12/05/19 1857 12/06/19 0258 12/09/19 0345  WBC 17.4* 16.0*  9.1  HGB 15.8 14.4 14.2  HCT 49.5 43.8 44.3  PLT 235 239 320  MCV 93.0 90.3 91.0  MCH 29.7 29.7 29.2  MCHC 31.9 32.9 32.1  RDW 13.5 13.6 13.5  LYMPHSABS 0.8 0.6*  --   MONOABS 1.5* 0.5  --   EOSABS 0.1 0.0  --   BASOSABS 0.1 0.0  --     Chemistries  Recent Labs  Lab 12/05/19 1857 12/06/19 0258 12/07/19 0311 12/08/19 0130 12/09/19 0345  NA 127* 131*  129* 135 132*  K 4.1 4.1 4.2 4.7 5.0  CL 93* 94* 93* 96* 93*  CO2 20* 24 25 28 27   GLUCOSE 157* 216* 184* 168* 164*  BUN 14 13 27* 26* 23  CREATININE 0.95 0.92 0.84 0.72 0.72  CALCIUM 8.7* 8.6* 8.5* 8.5* 8.1*  AST 34 30 45* 40 38  ALT 41 37 41 43 47*  ALKPHOS 128* 120 94 90 74  BILITOT 1.9* 1.2 0.7 0.6 1.0   ------------------------------------------------------------------------------------------------------------------ No results for input(s): CHOL, HDL, LDLCALC, TRIG, CHOLHDL, LDLDIRECT in the last 72 hours.  Lab Results  Component Value Date   HGBA1C 7.2 (H) 12/07/2019   ------------------------------------------------------------------------------------------------------------------ No results for input(s): TSH, T4TOTAL, T3FREE, THYROIDAB in the last 72 hours.  Invalid input(s): FREET3 ------------------------------------------------------------------------------------------------------------------ Recent Labs    12/08/19 0130 12/09/19 0345  FERRITIN 1,336* 1,148*    Coagulation profile No results for input(s): INR, PROTIME in the last 168 hours.  Recent Labs    12/08/19 0130 12/09/19 0345  DDIMER 1.58* 1.09*    Cardiac Enzymes No results for input(s): CKMB, TROPONINI, MYOGLOBIN in the last 168 hours.  Invalid input(s): CK ------------------------------------------------------------------------------------------------------------------    Component Value Date/Time   BNP 76.2 12/05/2019 2015    Micro Results Recent Results (from the past 240 hour(s))  Blood Culture (routine x 2)     Status: None (Preliminary result)   Collection Time: 12/05/19  8:00 PM   Specimen: BLOOD LEFT ARM  Result Value Ref Range Status   Specimen Description BLOOD LEFT ARM  Final   Special Requests   Final    BOTTLES DRAWN AEROBIC ONLY Blood Culture adequate volume   Culture   Final    NO GROWTH 4 DAYS Performed at [facility], [address], [place], [place]  [place]    Report Status PENDING  Incomplete  Blood Culture (routine x 2)     Status: None (Preliminary result)   Collection Time: 12/05/19  8:16 PM   Specimen: BLOOD RIGHT ARM  Result Value Ref Range Status   Specimen Description BLOOD RIGHT ARM  Final   Special Requests   Final    BOTTLES DRAWN AEROBIC AND ANAEROBIC Blood Culture adequate volume   Culture   Final    NO GROWTH 4 DAYS Performed at [facility], [address], [place], [place]    Report Status PENDING  Incomplete    Radiology Reports DG Chest 1 View  Result Date: 12/08/2019 CLINICAL DATA:  Dyspnea EXAM: CHEST  1 VIEW COMPARISON:  12/05/2019 FINDINGS: Bilateral interstitial and patchy alveolar airspace opacities. No pleural effusion or pneumothorax. Stable cardiomediastinal silhouette. No aggressive osseous lesion. IMPRESSION: Bilateral interstitial and patchy alveolar airspace opacities. Findings are concerning for multilobar pneumonia including atypical viral pneumonia. Electronically Signed   By: 12/07/2019   On: 12/08/2019 12:07   CT ANGIO CHEST PE W OR WO CONTRAST  Result Date: 12/06/2019 CLINICAL DATA:  Elevated D-dimer. Positive for COVID-19. EXAM:  CT ANGIOGRAPHY CHEST WITH CONTRAST TECHNIQUE: Multidetector CT imaging of the chest was performed using the standard protocol during bolus administration of intravenous contrast. Multiplanar CT image reconstructions and MIPs were obtained to evaluate the vascular anatomy. CONTRAST:  OMNIPAQUE IOHEXOL 350 MG/ML SOLN COMPARISON:  None. FINDINGS: Cardiovascular: Heart size is normal. Coronary artery calcifications are present. Mild atherosclerotic changes are present at the aortic arch. There is no aneurysm. Great vessel origins are within normal limits. Pulmonary artery opacification is satisfactory. Patient motion degrades evaluation of distal segmental branches. No significant proximal emboli are present. Mediastinum/Nodes: Multiple prominent mediastinal  lymph nodes present. High right paratracheal node measures up to 13 mm in short axis. Prevascular nodes measure up to 8 mm. AP window and subcarinal lymph nodes are present as well. There is mild prominence of lymphoid tissue at the hila bilaterally. No significant axillary adenopathy is present. Lungs/Pleura: Diffuse multifocal airspace disease is most prominent at the lung bases there is some atelectasis is well. No significant effusion is present. There is pneumothorax. Upper Abdomen: Diffuse fatty infiltration of the liver is noted. Upper abdomen is otherwise unremarkable. Musculoskeletal: Mild degenerative changes are present in the cervical spine. No focal lytic or blastic lesions are present. Review of the MIP images confirms the above findings. IMPRESSION: 1. No evidence for pulmonary embolus. 2. Diffuse multifocal airspace disease compatible with multi lobar pneumonia. 3. Mediastinal adenopathy is likely reactive. 4. Coronary artery disease. 5. Hepatic steatosis. Electronically Signed   By: Marin Roberts M.D.   On: 12/06/2019 07:35   DG Chest Portable 1 View  Result Date: 12/05/2019 CLINICAL DATA:  Shortness of breath, COVID-19 positive on Tuesday, increasing shortness of breath the past few days. Smoker. EXAM: PORTABLE CHEST 1 VIEW COMPARISON:  Radiograph February 10, 2016 FINDINGS: Some mixed interstitial airspace opacities with basilar and peripheral predominance. Low lung volumes likely some superimposed atelectatic changes as well. Prominence of the cardiomediastinal silhouette is likely accentuated by low volumes and portable technique. Remaining cardiomediastinal contours are unremarkable. No acute osseous or soft tissue abnormality. Degenerative changes are present in the imaged spine and shoulders. Telemetry leads overlie the chest. IMPRESSION: Some mixed interstitial and airspace opacities with basilar and peripheral predominance. Findings are compatible with acute infection in the setting  of COVID-19 positivity though edema could have a similar appearance. Electronically Signed   By: Kreg Shropshire M.D.   On: 12/05/2019 18:58   US Abdomen Limited RUQ  Result Date: 11/18/2019 CLINICAL DATA:  Upper abdominal pain EXAM: ULTRASOUND ABDOMEN LIMITED RIGHT UPPER QUADRANT COMPARISON:  CT abdomen and pelvis July 17, 2019. FINDINGS: Gallbladder: No gallstones or wall thickening visualized. There is no pericholecystic fluid. No sonographic Murphy sign noted by sonographer. Common bile duct: Diameter: 2 mm. No evident intrahepatic or extrahepatic biliary duct dilatation. Liver: No focal lesion identified. Liver echogenicity is increased diffusely. Portal vein is patent on color Doppler imaging with normal direction of blood flow towards the liver. Other: There is an a cyst arising from the lateral mid right kidney measuring 1.3 x 1.2 cm, also seen on prior CT examination. IMPRESSION: 1. Diffuse increase in liver echogenicity, a finding indicative of hepatic steatosis. While no focal liver lesions are evident on this study, it must be cautioned that the sensitivity of ultrasound for detection of focal liver lesions is diminished significantly in this circumstance. 2.  Cyst arising from mid right kidney. 3.  Study otherwise unremarkable. Electronically Signed   By: Bretta Bang III M.D.   On:  11/18/2019 09:29    

## 2019-12-09 NOTE — Progress Notes (Signed)
Received call from daughter Barth Kirks (not primary contact) requesting patient update. Patient Manuel Castillo provided verbal consent for Barth Kirks to receive update. Barth Kirks was called back and all questions were answered.   Was unable to reach Johnson, recording stating call could not be completed as dialed. Multiple extensions attempted without success.

## 2019-12-09 NOTE — Evaluation (Signed)
Physical Therapy Evaluation Patient Details Name: Manuel Castillo. MRN: [id number] DOB: 26-May-1946 Today's Date: 12/09/2019   History of Present Illness  74 year old male admitted iwth Pneumonia due to Covid 19. PMH includes HTN and asthma.  Clinical Impression  Male patient presents with reduced insight in current medical/pulmonary status. He indicates that "he is fine and just wants to go home". States he lives with spouse- that she has not been tested for Covid, "but she doesn't go anywhere anyway". Flat affect. Very HOH, but has bil hearing aides (being charged during PT evaluation). He relates that the house is all one level, walk in shower, no equipment, no outside steps, and that he was independent in all ADLs in PLOF. Should benefit from skilled PT to address optimal strengthening, safety in ambulation, optimal endurance and postural awareness.     Follow Up Recommendations      Equipment Recommendations  None recommended by PT    Recommendations for Other Services       Precautions / Restrictions Precautions Precautions: Fall Restrictions Weight Bearing Restrictions: No      Mobility  Bed Mobility Overal bed mobility: Independent;Modified Independent                Transfers Overall transfer level: Modified independent Equipment used: None                Ambulation/Gait Ambulation/Gait assistance: Supervision Gait Distance (Feet): 19 Feet(but continued to desat, fluctuate- RN aware and assisted in changing out probes to determine accuracy.)   Gait Pattern/deviations: Wide base of support;Shuffle   Gait velocity interpretation: 1.31 - 2.62 ft/sec, indicative of limited community Insurance account manager [employer] (Stroke Patients Only)       Balance Overall balance assessment: Mild deficits observed, not formally tested                                           Pertinent  Vitals/Pain Pain Assessment: No/denies pain    Home Living Family/patient expects to be discharged to:: Private residence Living Arrangements: Spouse/significant other   Type of Home: House       Home Layout: One level        Prior Function Level of Independence: Independent               Hand Dominance   Dominant Hand: Right    Extremity/Trunk Assessment        Lower Extremity Assessment Lower Extremity Assessment: Defer to PT evaluation;Generalized weakness    Cervical / Trunk Assessment Cervical / Trunk Assessment: Normal  Communication   Communication: No difficulties  Cognition Arousal/Alertness: Awake/alert Behavior During Therapy: WFL for tasks assessed/performed Overall Cognitive Status: Within Functional Limits for tasks assessed                                 General Comments: He is very HOH, but has hearing aides at bedside being charged.      General Comments General comments (skin integrity, edema, etc.): Fair tone and turgor, mild edema in LEs    Exercises General Exercises - Lower Extremity Ankle Circles/Pumps: AROM;Seated Short Arc Quad: AROM;Seated Hip ABduction/ADduction: AROM;Seated Hip Flexion/Marching: AROM;Seated Other Exercises Other Exercises: Reminded to practice  IS and FLUTTER UNIT- both available at bedside. He states he was told to do 3-4 times every hour, 8-10 breaths. Discussed hourly instead, 8-10 breaths with each unit   Assessment/Plan    PT Assessment Patient needs continued PT services  PT Problem List Cardiopulmonary status limiting activity;Decreased strength;Decreased activity tolerance;Decreased mobility       PT Treatment Interventions Gait training;Functional mobility training;Patient/family education;Therapeutic activities;Therapeutic exercise    PT Goals (Current goals can be found in the Care Plan section)  Acute Rehab PT Goals Patient Stated Goal: Just want to go home as I am fine- the  equipment here just does not work right PT Goal Formulation: With patient Time For Goal Achievement: 12/23/19    Frequency Min 3X/week   Barriers to discharge        Co-evaluation               AM-PAC PT "6 Clicks" Mobility  Outcome Measure Help needed turning from your back to your side while in a flat bed without using bedrails?: None Help needed moving from lying on your back to sitting on the side of a flat bed without using bedrails?: None Help needed moving to and from a bed to a chair (including a wheelchair)?: A Little Help needed standing up from a chair using your arms (e.g., wheelchair or bedside chair)?: A Little Help needed to walk in hospital room?: A Little Help needed climbing 3-5 steps with a railing? : A Lot 6 Click Score: 19    End of Session     Patient left: in chair Nurse Communication: Mobility status PT Visit Diagnosis: Muscle weakness (generalized) (M62.81)    Time: 7253-6644 PT Time Calculation (min) (ACUTE ONLY): 50 min   Charges:   PT Evaluation $PT Eval Moderate Complexity: 1 Mod PT Treatments $Gait Training: 8-22 mins $Therapeutic Exercise: 8-22 mins        Rollen Sox, PT # [phone] CGV cell   Casandra Doffing 12/09/2019, 12:00 PM

## 2019-12-09 NOTE — Progress Notes (Signed)
Patient worked with PT, sats dropped to 70's. Patient was not able to get sats to 80's on HFNC at 15. Placed NRB on patient, sats remained in the low 80's. Started HFNC and NRB, sats 92% at rest. MD notified of change.

## 2019-12-09 NOTE — Plan of Care (Signed)

## 2019-12-09 NOTE — Progress Notes (Signed)
Patient's daughter, Lorina Rabon, called back to request that she be contacted with updates regarding her father. She expressed concerns that the patient's wife also has covid and is not feeling well enough to discuss plan of care. With the patients permission, the wife's number was made inactive in the chart and Keri's number was added.

## 2019-12-10 LAB — CBC
HCT: 46.8 % (ref 39.0–52.0)
Hemoglobin: 15.4 g/dL (ref 13.0–17.0)
MCH: 29.2 pg (ref 26.0–34.0)
MCHC: 32.9 g/dL (ref 30.0–36.0)
MCV: 88.8 fL (ref 80.0–100.0)
Platelets: 364 10*3/uL (ref 150–400)
RBC: 5.27 MIL/uL (ref 4.22–5.81)
RDW: 13.3 % (ref 11.5–15.5)
WBC: 11.8 10*3/uL — ABNORMAL HIGH (ref 4.0–10.5)
nRBC: 0 % (ref 0.0–0.2)

## 2019-12-10 LAB — CULTURE, BLOOD (ROUTINE X 2)
Culture: NO GROWTH
Culture: NO GROWTH
Special Requests: ADEQUATE
Special Requests: ADEQUATE

## 2019-12-10 LAB — GLUCOSE, CAPILLARY
Glucose-Capillary: 139 mg/dL — ABNORMAL HIGH (ref 70–99)
Glucose-Capillary: 216 mg/dL — ABNORMAL HIGH (ref 70–99)
Glucose-Capillary: 114 mg/dL — ABNORMAL HIGH (ref 70–99)
Glucose-Capillary: 194 mg/dL — ABNORMAL HIGH (ref 70–99)

## 2019-12-10 LAB — COMPREHENSIVE METABOLIC PANEL
ALT: 43 U/L (ref 0–44)
AST: 30 U/L (ref 15–41)
Albumin: 3.1 g/dL — ABNORMAL LOW (ref 3.5–5.0)
Alkaline Phosphatase: 72 U/L (ref 38–126)
Anion gap: 11 (ref 5–15)
BUN: 21 mg/dL (ref 8–23)
CO2: 30 mmol/L (ref 22–32)
Calcium: 8.3 mg/dL — ABNORMAL LOW (ref 8.9–10.3)
Chloride: 91 mmol/L — ABNORMAL LOW (ref 98–111)
Creatinine, Ser: 0.71 mg/dL (ref 0.61–1.24)
GFR calc Af Amer: >60 mL/min (ref >60)
GFR calc non Af Amer: >60 mL/min (ref >60)
Glucose, Bld: 167 mg/dL — ABNORMAL HIGH (ref 70–99)
Potassium: 4.9 mmol/L (ref 3.5–5.1)
Sodium: 132 mmol/L — ABNORMAL LOW (ref 135–145)
Total Bilirubin: 0.9 mg/dL (ref 0.3–1.2)
Total Protein: 6.4 g/dL — ABNORMAL LOW (ref 6.5–8.1)

## 2019-12-10 LAB — D-DIMER, QUANTITATIVE: D-Dimer, Quant: 0.94 ug/mL-FEU — ABNORMAL HIGH (ref 0.00–0.50)

## 2019-12-10 LAB — C-REACTIVE PROTEIN: CRP: 4.4 mg/dL — ABNORMAL HIGH (ref <1.0)

## 2019-12-10 LAB — FERRITIN: Ferritin: 878 ng/mL — ABNORMAL HIGH (ref 24–336)

## 2019-12-10 MED ORDER — FUROSEMIDE 10 MG/ML IJ SOLN
40.0000 mg | Freq: Once | INTRAMUSCULAR | Status: AC
  Administered 2019-12-10: 40 mg via INTRAVENOUS
  Filled 2019-12-10: qty 4

## 2019-12-10 NOTE — Progress Notes (Signed)
Called Keri with updates, patient is currently on HFNC at 15 when at rest and using the NRB with activity. He is in good spirits and has no complaints at this time. All questions answered.

## 2019-12-10 NOTE — Progress Notes (Signed)
PROGRESS NOTE                                                                                                                                                                                                             Patient Demographics:    Manuel Castillo, is a 74 y.o. male, DOB - Apr 07, 1946, DUK:[id number]  Outpatient Primary MD for the patient is [facility]   Admit date - 12/05/2019   LOS - 5  Chief Complaint  Patient presents with  . covid symptoms  . Shortness of Breath       Brief Narrative: Patient is a 74 y.o. male with PMHx of hypertension, asthma-who presented with 4-day history of shortness of breath, subjective fevers-he was found to have acute hypoxic respiratory failure secondary to COVID-19 pneumonia.  Patient was diagnosed with COVID-19 on 1/12 at his PCPs office   Subjective:   No major issues overnight-still desaturates with minimal activity.  Requiring 13-15 L of oxygen at rest.  He was sitting up at bedside chair-and eating breakfast when I saw him this morning.  He appears very comfortable.    Assessment  & Plan :   Acute hypoxic respiratory failure secondary to COVID-19 pneumonia: Remains severely hypoxemic-requiring 13-15 L of HFNC-appears very comfortable.  Continue steroids-continue close monitoring-if he develops signs of respiratory distress-he will need to be moved to the ICU.  CTA chest on 1/17 negative  for PE.  Fever: afebrile  O2 requirements:  SpO2: 95 % O2 Flow Rate (L/min): 13 L/min   COVID-19 Labs: Recent Labs    12/08/19 0130 12/09/19 0345 12/10/19 0430  DDIMER 1.58* 1.09* 0.94*  FERRITIN 1,336* 1,148* 878*  CRP 16.8* 7.8* 4.4*       Component Value Date/Time   BNP 76.2 12/05/2019 2015    Recent Labs  Lab 12/05/19 2015  PROCALCITON 0.76    No results found for: SARSCOV2NAA   COVID-19 Medications: Steroids:1/16>> Remdesivir:  1/16>> 1/20 Actemra: 1/17 x 1  Other medications: Diuretics:Euvolemic-repeat Lasix 40 mg IV x1 to maintain negative balance. Antibiotics:Not needed as no evidence of bacterial infection  Prone/Incentive Spirometry: encouraged patient to lie prone for 3-4 hours at a time for a total of 16 hours a day, and to encourage incentive spirometry use 3-4/hour.  DVT Prophylaxis  : Lovenox@twice  daily dosing given severity  of hypoxemia.  HTN: BP controlled-losartan on hold.  Hyponatremia: Mild-stable for close follow-up without any work-up.  Asthma: Continue bronchodilators-no signs of exacerbation.  GERD: Continue Pepcid  Anxiety: Continue low-dose Xanax as needed.  Obesity: Estimated body mass index is 41.81 kg/m as calculated from the following:   Height as of this encounter: 5\' 8"  (1.727 m).   Weight as of this encounter: 124.7 kg.    Consults  :  None  Procedures  :  None  ABG: No results found for: PHART, PCO2ART, PO2ART, HCO3, TCO2, ACIDBASEDEF, O2SAT  Vent Settings: N/A  Condition - Extremely Guarded  Family Communication  :  Daughter updated over the phone on 1/21  Code Status :  Full Code  Diet :  Diet Order            Diet Heart Room service appropriate? Yes; Fluid consistency: Thin  Diet effective now               Disposition Plan  :  Remain hospitalized-probably will require home health services on discharge  Barriers to discharge: Hypoxia requiring O2 supplementation  Antimicorbials  :    Anti-infectives (From admission, onward)   Start     Dose/Rate Route Frequency Ordered Stop   12/06/19 1000  remdesivir 100 mg in sodium chloride 0.9 % 100 mL IVPB     100 mg 200 mL/hr over 30 Minutes Intravenous Daily 12/05/19 2137 12/09/19 1145   12/05/19 2230  remdesivir 200 mg in sodium chloride 0.9% 250 mL IVPB     200 mg 580 mL/hr over 30 Minutes Intravenous Once 12/05/19 2137 12/06/19 0000      Inpatient Medications  Scheduled Meds: . aspirin EC  81  mg Oral Daily  . chlorpheniramine-HYDROcodone  5 mL Oral Q12H  . dexamethasone (DECADRON) injection  6 mg Intravenous Q24H  . enoxaparin (LOVENOX) injection  0.5 mg/kg Subcutaneous Q12H  . famotidine  20 mg Oral BID  . fluticasone  1-2 spray Each Nare Daily  . insulin aspart  0-9 Units Subcutaneous TID WC  . mometasone-formoterol  2 puff Inhalation BID  . sodium chloride flush  3 mL Intravenous Once   Continuous Infusions: PRN Meds:.acetaminophen, albuterol, ALPRAZolam, guaiFENesin-dextromethorphan  Time Spent in minutes  35   See all Orders from today for further details  12/08/19 M.D on 12/10/2019 at 1:58 PM  To page go to [email or web address] - use universal password  [facility] -  Office  [phone]    Objective:   Vitals:   12/10/19 0500 12/10/19 0738 12/10/19 1129 12/10/19 1229  BP:  138/61    Pulse:    66  Resp:    (!) 21  Temp:  97.9 F (36.6 C) 97.7 F (36.5 C)   TempSrc:  Oral Oral   SpO2: 100% (!) 85%  95%  Weight:      Height:        Wt Readings from Last 3 Encounters:  12/06/19 124.7 kg     Intake/Output Summary (Last 24 hours) at 12/10/2019 1358 Last data filed at 12/10/2019 0800 Gross per 24 hour  Intake 540 ml  Output 3150 ml  Net -2610 ml     Physical Exam Gen Exam:Alert awake-not in any distress HEENT:atraumatic, normocephalic Chest: B/L clear to auscultation anteriorly CVS:S1S2 regular Abdomen:soft non tender, non distended Extremities:no edema Neurology: Non focal Skin: no rash   Data Review:    CBC Recent Labs  Lab 12/05/19 1857 12/06/19 0258 12/09/19 0345 12/10/19 0430  WBC 17.4* 16.0* 9.1 11.8*  HGB 15.8 14.4 14.2 15.4  HCT 49.5 43.8 44.3 46.8  PLT 235 239 320 364  MCV 93.0 90.3 91.0 88.8  MCH 29.7 29.7 29.2 29.2  MCHC 31.9 32.9 32.1 32.9  RDW 13.5 13.6 13.5 13.3  LYMPHSABS 0.8 0.6*  --   --   MONOABS 1.5* 0.5  --   --   EOSABS 0.1 0.0  --   --   BASOSABS 0.1 0.0  --   --     Chemistries  Recent  Labs  Lab 12/06/19 0258 12/07/19 0311 12/08/19 0130 12/09/19 0345 12/10/19 0430  NA 131* 129* 135 132* 132*  K 4.1 4.2 4.7 5.0 4.9  CL 94* 93* 96* 93* 91*  CO2 24 25 28 27 30   GLUCOSE 216* 184* 168* 164* 167*  BUN 13 27* 26* 23 21  CREATININE 0.92 0.84 0.72 0.72 0.71  CALCIUM 8.6* 8.5* 8.5* 8.1* 8.3*  AST 30 45* 40 38 30  ALT 37 41 43 47* 43  ALKPHOS 120 94 90 74 72  BILITOT 1.2 0.7 0.6 1.0 0.9   ------------------------------------------------------------------------------------------------------------------ No results for input(s): CHOL, HDL, LDLCALC, TRIG, CHOLHDL, LDLDIRECT in the last 72 hours.  Lab Results  Component Value Date   HGBA1C 7.2 (H) 12/07/2019   ------------------------------------------------------------------------------------------------------------------ No results for input(s): TSH, T4TOTAL, T3FREE, THYROIDAB in the last 72 hours.  Invalid input(s): FREET3 ------------------------------------------------------------------------------------------------------------------ Recent Labs    12/09/19 0345 12/10/19 0430  FERRITIN 1,148* 878*    Coagulation profile No results for input(s): INR, PROTIME in the last 168 hours.  Recent Labs    12/09/19 0345 12/10/19 0430  DDIMER 1.09* 0.94*    Cardiac Enzymes No results for input(s): CKMB, TROPONINI, MYOGLOBIN in the last 168 hours.  Invalid input(s): CK ------------------------------------------------------------------------------------------------------------------    Component Value Date/Time   BNP 76.2 12/05/2019 2015    Micro Results Recent Results (from the past 240 hour(s))  Blood Culture (routine x 2)     Status: None   Collection Time: 12/05/19  8:00 PM   Specimen: BLOOD LEFT ARM  Result Value Ref Range Status   Specimen Description BLOOD LEFT ARM  Final   Special Requests   Final    BOTTLES DRAWN AEROBIC ONLY Blood Culture adequate volume   Culture   Final    NO GROWTH 5  DAYS Performed at [facility], [address], [place], [place]    Report Status 12/10/2019 FINAL  Final  Blood Culture (routine x 2)     Status: None   Collection Time: 12/05/19  8:16 PM   Specimen: BLOOD RIGHT ARM  Result Value Ref Range Status   Specimen Description BLOOD RIGHT ARM  Final   Special Requests   Final    BOTTLES DRAWN AEROBIC AND ANAEROBIC Blood Culture adequate volume   Culture   Final    NO GROWTH 5 DAYS Performed at [facility], [address], [place], [place]    Report Status 12/10/2019 FINAL  Final    Radiology Reports DG Chest 1 View  Result Date: 12/08/2019 CLINICAL DATA:  Dyspnea EXAM: CHEST  1 VIEW COMPARISON:  12/05/2019 FINDINGS: Bilateral interstitial and patchy alveolar airspace opacities. No pleural effusion or pneumothorax. Stable cardiomediastinal silhouette. No aggressive osseous lesion. IMPRESSION: Bilateral interstitial and patchy alveolar airspace opacities. Findings are concerning for multilobar pneumonia including atypical viral pneumonia. Electronically Signed   By: 12/07/2019   On: 12/08/2019 12:07  CT ANGIO CHEST PE W OR WO CONTRAST  Result Date: 12/06/2019 CLINICAL DATA:  Elevated D-dimer. Positive for COVID-19. EXAM: CT ANGIOGRAPHY CHEST WITH CONTRAST TECHNIQUE: Multidetector CT imaging of the chest was performed using the standard protocol during bolus administration of intravenous contrast. Multiplanar CT image reconstructions and MIPs were obtained to evaluate the vascular anatomy. CONTRAST:  139mL OMNIPAQUE IOHEXOL 350 MG/ML SOLN COMPARISON:  None. FINDINGS: Cardiovascular: Heart size is normal. Coronary artery calcifications are present. Mild atherosclerotic changes are present at the aortic arch. There is no aneurysm. Great vessel origins are within normal limits. Pulmonary artery opacification is satisfactory. Patient motion degrades evaluation of distal segmental branches. No significant proximal emboli  are present. Mediastinum/Nodes: Multiple prominent mediastinal lymph nodes present. High right paratracheal node measures up to 13 mm in short axis. Prevascular nodes measure up to 8 mm. AP window and subcarinal lymph nodes are present as well. There is mild prominence of lymphoid tissue at the hila bilaterally. No significant axillary adenopathy is present. Lungs/Pleura: Diffuse multifocal airspace disease is most prominent at the lung bases there is some atelectasis is well. No significant effusion is present. There is pneumothorax. Upper Abdomen: Diffuse fatty infiltration of the liver is noted. Upper abdomen is otherwise unremarkable. Musculoskeletal: Mild degenerative changes are present in the cervical spine. No focal lytic or blastic lesions are present. Review of the MIP images confirms the above findings. IMPRESSION: 1. No evidence for pulmonary embolus. 2. Diffuse multifocal airspace disease compatible with multi lobar pneumonia. 3. Mediastinal adenopathy is likely reactive. 4. Coronary artery disease. 5. Hepatic steatosis. Electronically Signed   By: San Morelle M.D.   On: 12/06/2019 07:35   DG Chest Portable 1 View  Result Date: 12/05/2019 CLINICAL DATA:  Shortness of breath, COVID-19 positive on Tuesday, increasing shortness of breath the past few days. Smoker. EXAM: PORTABLE CHEST 1 VIEW COMPARISON:  Radiograph February 10, 2016 FINDINGS: Some mixed interstitial airspace opacities with basilar and peripheral predominance. Low lung volumes likely some superimposed atelectatic changes as well. Prominence of the cardiomediastinal silhouette is likely accentuated by low volumes and portable technique. Remaining cardiomediastinal contours are unremarkable. No acute osseous or soft tissue abnormality. Degenerative changes are present in the imaged spine and shoulders. Telemetry leads overlie the chest. IMPRESSION: Some mixed interstitial and airspace opacities with basilar and peripheral  predominance. Findings are compatible with acute infection in the setting of COVID-19 positivity though edema could have a similar appearance. Electronically Signed   By: Lovena Le M.D.   On: 12/05/2019 18:58   US Abdomen Limited RUQ  Result Date: 11/18/2019 CLINICAL DATA:  Upper abdominal pain EXAM: ULTRASOUND ABDOMEN LIMITED RIGHT UPPER QUADRANT COMPARISON:  CT abdomen and pelvis July 17, 2019. FINDINGS: Gallbladder: No gallstones or wall thickening visualized. There is no pericholecystic fluid. No sonographic Murphy sign noted by sonographer. Common bile duct: Diameter: 2 mm. No evident intrahepatic or extrahepatic biliary duct dilatation. Liver: No focal lesion identified. Liver echogenicity is increased diffusely. Portal vein is patent on color Doppler imaging with normal direction of blood flow towards the liver. Other: There is an a cyst arising from the lateral mid right kidney measuring 1.3 x 1.2 cm, also seen on prior CT examination. IMPRESSION: 1. Diffuse increase in liver echogenicity, a finding indicative of hepatic steatosis. While no focal liver lesions are evident on this study, it must be cautioned that the sensitivity of ultrasound for detection of focal liver lesions is diminished significantly in this circumstance. 2.  Cyst arising from  mid right kidney. 3.  Study otherwise unremarkable. Electronically Signed   By: Bretta BangWilliam  Woodruff III M.D.   On: 11/18/2019 09:29

## 2019-12-10 NOTE — Progress Notes (Signed)
Occupational Therapy Evaluation Patient Details Name: Manuel Castillo. MRN: [id number] DOB: Nov 12, 1946 Today's Date: 12/10/2019    History of Present Illness 74 year old male admitted iwth Pneumonia due to Covid 19. PMH includes HTN and asthma.   Clinical Impression   PTA pt lived with his wife, independent in all ADL, IADL, and mobility tasks. Pt does not ambulate with an assistive device and reports 0 falls in the last 6 months. Pt does not use oxygen at home and is currently on 15L HFNC. Pt currently independent to min guard for self-care and functional transfer tasks. SpO2 94% at rest on 15L HFNC. Pt able to ambulate to/from bathroom, complete toileting task, and stand ~1 min at the sink to wash hands. SpO2 decreased to 81% on 15L HFNC with pt requiring ~2 min seated recovery back to 92%. Pt reported mod shortness of breath with activity. Educated pt on energy conservation techniques, breathing strategies, and relaxation strategies with fair understanding. Pt demonstrates decreased strength, endurance, balance, standing tolerance, and activity tolerance impacting ability to complete self-care and functional transfer tasks. Recommend skilled OT services to address above deficits in order to promote function and prevent further decline.     Follow Up Recommendations  No OT follow up;Supervision - Intermittent    Equipment Recommendations  3 in 1 bedside commode(for use in shower)    Recommendations for Other Services       Precautions / Restrictions Precautions Precautions: Fall;Other (comment) Precaution Comments: Monitor SpO2, pt desats Restrictions Weight Bearing Restrictions: No      Mobility Bed Mobility               General bed mobility comments: Pt seated in bedside chair upon OT arrival.  Transfers Overall transfer level: Needs assistance Equipment used: None Transfers: Sit to/from Stand;Stand Pivot Transfers Sit to Stand: Supervision Stand pivot transfers:  Supervision       General transfer comment: to ensure balance and safety    Balance Overall balance assessment: Mild deficits observed, not formally tested                                         ADL either performed or assessed with clinical judgement   ADL Overall ADL's : Needs assistance/impaired Eating/Feeding: Independent;Sitting   Grooming: Supervision/safety;Set up;Standing   Upper Body Bathing: Set up;Supervision/ safety;Sitting   Lower Body Bathing: Supervison/ safety;Sit to/from stand;Sitting/lateral leans;Minimal assistance   Upper Body Dressing : Supervision/safety;Set up;Sitting   Lower Body Dressing: Supervision/safety;Min guard;Sitting/lateral leans;Sit to/from stand   Toilet Transfer: Solicitor;Ambulation;Grab bars   Toileting- Clothing Manipulation and Hygiene: Supervision/safety;Min guard;Sit to/from stand;Sitting/lateral lean       Functional mobility during ADLs: Min guard General ADL Comments: Pt able to ambulate to/from bathroom with min guard and without an assistive device. Noted 0 instances of LOB, however pt unsteady on feet.     Vision Baseline Vision/History: No visual deficits       Perception     Praxis      Pertinent Vitals/Pain Pain Assessment: No/denies pain     Hand Dominance Right   Extremity/Trunk Assessment Upper Extremity Assessment Upper Extremity Assessment: Overall WFL for tasks assessed   Lower Extremity Assessment Lower Extremity Assessment: Defer to PT evaluation       Communication Communication Communication: HOH(has bilateral hearing aides with him)   Cognition Arousal/Alertness: Awake/alert Behavior During Therapy: [facility] for  tasks assessed/performed Overall Cognitive Status: Within Functional Limits for tasks assessed                                     General Comments  Pt on 15L HFNC with SpO2 94% at rest. SpO2 decreased to 81% on 15L HFNC following  mobility with pt requiring ~2 min seated recovery back to 92%.     Exercises Exercises: Other exercises Other Exercises Other Exercises: Incentive spirometer x 10 with min cues on technique. Pulling . Other Exercises: Flutter valve x 10 with min cues on technique.   Shoulder Instructions      Home Living Family/patient expects to be discharged to:: Private residence Living Arrangements: Spouse/significant other Available Help at Discharge: Family Type of Home: House Home Access: Stairs to enter Secretary/administrator of Steps: 5   Home Layout: One level     Bathroom Shower/Tub: Producer, television/film/video: Standard     Home Equipment: None          Prior Functioning/Environment Level of Independence: Independent        Comments: Pt independent in ADLs, IADLs, and mobility. Pt does not ambulate with an assisitve device and reports 0 falls in the last 6 months. Pt still drives. Pt does not use oxygen at home.        OT Problem List: Decreased strength;Decreased activity tolerance;Impaired balance (sitting and/or standing);Decreased knowledge of use of DME or AE;Cardiopulmonary status limiting activity      OT Treatment/Interventions: Self-care/ADL training;Therapeutic exercise;Neuromuscular education;Energy conservation;DME and/or AE instruction;Therapeutic activities;Patient/family education;Balance training    OT Goals(Current goals can be found in the care plan section) Acute Rehab OT Goals Patient Stated Goal: To go home Time For Goal Achievement: 12/24/19 Potential to Achieve Goals: Good ADL Goals Pt Will Perform Grooming: standing;with modified independence Pt Will Perform Lower Body Bathing: sit to/from stand;with modified independence Pt Will Perform Lower Body Dressing: sit to/from stand;with modified independence Pt Will Transfer to Toilet: with modified independence;ambulating;regular height toilet Pt Will Perform Toileting - Clothing  Manipulation and hygiene: with modified independence;sit to/from stand Additional ADL Goal #1: Pt to recall and verbalize 3 relaxation strategies with 0 verbal cues. Additional ADL Goal #2: Pt to recall and verbalize 3 energy conservation techniques with 0 verbal cues. Additional ADL Goal #3: Pt to tolerate standing up to 5 min with modified independence and SpO2 maintaining in the 90s, in preparation for ADLs.  OT Frequency: Min 3X/week   Barriers to D/C:            Co-evaluation              AM-PAC OT "6 Clicks" Daily Activity     Outcome Measure Help from another person eating meals?: None Help from another person taking care of personal grooming?: A Little Help from another person toileting, which includes using toliet, bedpan, or urinal?: A Little Help from another person bathing (including washing, rinsing, drying)?: A Little Help from another person to put on and taking off regular upper body clothing?: A Little Help from another person to put on and taking off regular lower body clothing?: A Little 6 Click Score: 19   End of Session Equipment Utilized During Treatment: Oxygen Nurse Communication: Mobility status  Activity Tolerance: Patient limited by fatigue(Limited by SOB) Patient left: in chair;with call bell/phone within reach  OT Visit Diagnosis: Unsteadiness on feet (R26.81);Muscle weakness (generalized) (M62.81)  Time: 1859-0931 OT Time Calculation (min): 29 min Charges:  OT General Charges $OT Visit: 1 Visit OT Evaluation $OT Eval Moderate Complexity: 1 Mod OT Treatments $Self Care/Home Management : 8-22 mins  Mauri Brooklyn OTR/L [phone]   Mauri Brooklyn 12/10/2019, 3:22 PM

## 2019-12-11 LAB — COMPREHENSIVE METABOLIC PANEL
ALT: 43 U/L (ref 0–44)
AST: 29 U/L (ref 15–41)
Albumin: 3 g/dL — ABNORMAL LOW (ref 3.5–5.0)
Alkaline Phosphatase: 72 U/L (ref 38–126)
Anion gap: 12 (ref 5–15)
BUN: 25 mg/dL — ABNORMAL HIGH (ref 8–23)
CO2: 29 mmol/L (ref 22–32)
Calcium: 8.4 mg/dL — ABNORMAL LOW (ref 8.9–10.3)
Chloride: 91 mmol/L — ABNORMAL LOW (ref 98–111)
Creatinine, Ser: 0.84 mg/dL (ref 0.61–1.24)
GFR calc Af Amer: >60 mL/min (ref >60)
GFR calc non Af Amer: >60 mL/min (ref >60)
Glucose, Bld: 202 mg/dL — ABNORMAL HIGH (ref 70–99)
Potassium: 4.7 mmol/L (ref 3.5–5.1)
Sodium: 132 mmol/L — ABNORMAL LOW (ref 135–145)
Total Bilirubin: 0.7 mg/dL (ref 0.3–1.2)
Total Protein: 6.2 g/dL — ABNORMAL LOW (ref 6.5–8.1)

## 2019-12-11 LAB — CBC
HCT: 46.8 % (ref 39.0–52.0)
Hemoglobin: 15.3 g/dL (ref 13.0–17.0)
MCH: 29.5 pg (ref 26.0–34.0)
MCHC: 32.7 g/dL (ref 30.0–36.0)
MCV: 90.2 fL (ref 80.0–100.0)
Platelets: 374 10*3/uL (ref 150–400)
RBC: 5.19 MIL/uL (ref 4.22–5.81)
RDW: 13.4 % (ref 11.5–15.5)
WBC: 15.4 10*3/uL — ABNORMAL HIGH (ref 4.0–10.5)
nRBC: 0 % (ref 0.0–0.2)

## 2019-12-11 LAB — GLUCOSE, CAPILLARY
Glucose-Capillary: 131 mg/dL — ABNORMAL HIGH (ref 70–99)
Glucose-Capillary: 171 mg/dL — ABNORMAL HIGH (ref 70–99)
Glucose-Capillary: 145 mg/dL — ABNORMAL HIGH (ref 70–99)
Glucose-Capillary: 212 mg/dL — ABNORMAL HIGH (ref 70–99)

## 2019-12-11 LAB — C-REACTIVE PROTEIN: CRP: 2.5 mg/dL — ABNORMAL HIGH (ref <1.0)

## 2019-12-11 LAB — FERRITIN: Ferritin: 691 ng/mL — ABNORMAL HIGH (ref 24–336)

## 2019-12-11 LAB — D-DIMER, QUANTITATIVE: D-Dimer, Quant: 1.12 ug/mL-FEU — ABNORMAL HIGH (ref 0.00–0.50)

## 2019-12-11 MED ORDER — SALINE SPRAY 0.65 % NA SOLN
1.0000 | NASAL | Status: DC | PRN
  Administered 2019-12-11: 09:00:00 1 via NASAL
  Filled 2019-12-11: qty 44

## 2019-12-11 MED ORDER — FUROSEMIDE 10 MG/ML IJ SOLN
40.0000 mg | Freq: Once | INTRAMUSCULAR | Status: AC
  Administered 2019-12-11: 12:00:00 40 mg via INTRAVENOUS
  Filled 2019-12-11: qty 4

## 2019-12-11 NOTE — Plan of Care (Signed)

## 2019-12-11 NOTE — Progress Notes (Addendum)
Pt sitting up in chair. Sats at this time on 9.5L HFNC is 91-92%. Pt sats ambulating on 15HFNC was 83-85%. Ambulating in room pt needs >15L. Pt encouraged to use IS and flutter valve.Marland Kitchen

## 2019-12-11 NOTE — Progress Notes (Signed)
Pt is in bed and seems anxious. VSS. No apparent distress. O2 sats 87-88% resting on 10L HFNC. Pt encouraged to deep breathe and pt performed IS and flutter valve successful. Sats remain 88-89% Placed pt on 12L and sats at 91-92%. Will continue to monitor.

## 2019-12-11 NOTE — Progress Notes (Addendum)
PROGRESS NOTE                                                                                                                                                                                                             Patient Demographics:    Manuel Castillo, is a 74 y.o. male, DOB - [birth year], ELF:[id number]  Outpatient Primary MD for the patient is [facility]   Admit date - 12/05/2019   LOS - 6  Chief Complaint  Patient presents with  . covid symptoms  . Shortness of Breath       Brief Narrative: Patient is a 74 y.o. male with PMHx of hypertension, asthma-who presented with 4-day history of shortness of breath, subjective fevers-he was found to have acute hypoxic respiratory failure secondary to COVID-19 pneumonia.  Patient was diagnosed with COVID-19 on 1/12 at his PCPs office   Subjective:   Sitting up at bedside chair-no major events overnight per RN.  Patient claims he feels good.  Titrated down to 10 L.   Assessment  & Plan :   Acute hypoxic respiratory failure secondary to COVID-19 pneumonia: Still pretty hypoxic but slowly improving.  Titrated down to 10 L of HFNC today.  Continue steroids-CRP continues to downtrend..  CTA chest on 1/17 negative  for PE.  Fever: afebrile  O2 requirements:  SpO2: 91 % O2 Flow Rate (L/min): 10 L/min   COVID-19 Labs: Recent Labs    12/09/19 0345 12/10/19 0430 12/11/19 0032  DDIMER 1.09* 0.94* 1.12*  FERRITIN 1,148* 878* 691*  CRP 7.8* 4.4* 2.5*       Component Value Date/Time   BNP 76.2 12/05/2019 2015    Recent Labs  Lab 12/05/19 2015  PROCALCITON 0.76    No results found for: SARSCOV2NAA   COVID-19 Medications: Steroids:1/16>> Remdesivir: 1/16>> 1/20 Actemra: 1/17 x 1  Other medications: Diuretics:Euvolemic-repeat Lasix 40 mg IV x1 to maintain negative balance (-3.3 L so far). Antibiotics:Not needed as no evidence of  bacterial infection  Prone/Incentive Spirometry: encouraged patient to lie prone for 3-4 hours at a time for a total of 16 hours a day, and to encourage incentive spirometry use 3-4/hour.  DVT Prophylaxis  : Lovenox@twice  daily dosing given severity of hypoxemia.  HTN: BP controlled-losartan on hold.  Hyponatremia: Mild-stable for close follow-up without any work-up.  Asthma:  Continue bronchodilators-no signs of exacerbation.  GERD: Continue Pepcid  Anxiety: Continue low-dose Xanax as needed.  Obesity: Estimated body mass index is 41.81 kg/m as calculated from the following:   Height as of this encounter: 5\' 8"  (1.727 m).   Weight as of this encounter: 124.7 kg.    Consults  :  None  Procedures  :  None  ABG: No results found for: PHART, PCO2ART, PO2ART, HCO3, TCO2, ACIDBASEDEF, O2SAT  Vent Settings: N/A  Condition - Extremely Guarded  Family Communication  :  Daughter updated over the phone on 1/22  Code Status :  Full Code  Diet :  Diet Order            Diet Heart Room service appropriate? Yes; Fluid consistency: Thin  Diet effective now               Disposition Plan  :  Remain hospitalized-probably will require home health services on discharge  Barriers to discharge: Hypoxia requiring O2 supplementation  Antimicorbials  :    Anti-infectives (From admission, onward)   Start     Dose/Rate Route Frequency Ordered Stop   12/06/19 1000  remdesivir 100 mg in sodium chloride 0.9 % 100 mL IVPB     100 mg 200 mL/hr over 30 Minutes Intravenous Daily 12/05/19 2137 12/09/19 1145   12/05/19 2230  remdesivir 200 mg in sodium chloride 0.9% 250 mL IVPB     200 mg 580 mL/hr over 30 Minutes Intravenous Once 12/05/19 2137 12/06/19 0000      Inpatient Medications  Scheduled Meds: . aspirin EC  81 mg Oral Daily  . chlorpheniramine-HYDROcodone  5 mL Oral Q12H  . dexamethasone (DECADRON) injection  6 mg Intravenous Q24H  . enoxaparin (LOVENOX) injection  0.5 mg/kg  Subcutaneous Q12H  . famotidine  20 mg Oral BID  . fluticasone  1-2 spray Each Nare Daily  . insulin aspart  0-9 Units Subcutaneous TID WC  . mometasone-formoterol  2 puff Inhalation BID  . sodium chloride flush  3 mL Intravenous Once   Continuous Infusions: PRN Meds:.acetaminophen, albuterol, ALPRAZolam, guaiFENesin-dextromethorphan, sodium chloride  Time Spent in minutes  35   See all Orders from today for further details  Oren Binet M.D on 12/11/2019 at 11:35 AM  To page go to [email or web address] - use universal password  [facility] -  Office  [phone]    Objective:   Vitals:   12/11/19 1050 12/11/19 1051 12/11/19 1052 12/11/19 1053  BP:      Pulse: 77 76 77 78  Resp: 20 (!) 23 19 (!) 22  Temp:      TempSrc:      SpO2: 91% 91% 91% 91%  Weight:      Height:        Wt Readings from Last 3 Encounters:  12/06/19 124.7 kg     Intake/Output Summary (Last 24 hours) at 12/11/2019 1135 Last data filed at 12/11/2019 0423 Gross per 24 hour  Intake 1250 ml  Output 1350 ml  Net -100 ml     Physical Exam Gen Exam:Alert awake-not in any distress HEENT:atraumatic, normocephalic Chest: B/L clear to auscultation anteriorly CVS:S1S2 regular Abdomen:soft non tender, non distended Extremities:trace edema Neurology: Non focal Skin: no rash   Data Review:    CBC Recent Labs  Lab 12/05/19 1857 12/06/19 0258 12/09/19 0345 12/10/19 0430 12/11/19 0032  WBC 17.4* 16.0* 9.1 11.8* 15.4*  HGB 15.8 14.4 14.2 15.4 15.3  HCT 49.5 43.8 44.3 46.8 46.8  PLT 235 239 320 364 374  MCV 93.0 90.3 91.0 88.8 90.2  MCH 29.7 29.7 29.2 29.2 29.5  MCHC 31.9 32.9 32.1 32.9 32.7  RDW 13.5 13.6 13.5 13.3 13.4  LYMPHSABS 0.8 0.6*  --   --   --   MONOABS 1.5* 0.5  --   --   --   EOSABS 0.1 0.0  --   --   --   BASOSABS 0.1 0.0  --   --   --     Chemistries  Recent Labs  Lab 12/07/19 0311 12/08/19 0130 12/09/19 0345 12/10/19 0430 12/11/19 0032  NA 129* 135 132* 132*  132*  K 4.2 4.7 5.0 4.9 4.7  CL 93* 96* 93* 91* 91*  CO2 25 28 27 30 29   GLUCOSE 184* 168* 164* 167* 202*  BUN 27* 26* 23 21 25*  CREATININE 0.84 0.72 0.72 0.71 0.84  CALCIUM 8.5* 8.5* 8.1* 8.3* 8.4*  AST 45* 40 38 30 29  ALT 41 43 47* 43 43  ALKPHOS 94 90 74 72 72  BILITOT 0.7 0.6 1.0 0.9 0.7   ------------------------------------------------------------------------------------------------------------------ No results for input(s): CHOL, HDL, LDLCALC, TRIG, CHOLHDL, LDLDIRECT in the last 72 hours.  Lab Results  Component Value Date   HGBA1C 7.2 (H) 12/07/2019   ------------------------------------------------------------------------------------------------------------------ No results for input(s): TSH, T4TOTAL, T3FREE, THYROIDAB in the last 72 hours.  Invalid input(s): FREET3 ------------------------------------------------------------------------------------------------------------------ Recent Labs    12/10/19 0430 12/11/19 0032  FERRITIN 878* 691*    Coagulation profile No results for input(s): INR, PROTIME in the last 168 hours.  Recent Labs    12/10/19 0430 12/11/19 0032  DDIMER 0.94* 1.12*    Cardiac Enzymes No results for input(s): CKMB, TROPONINI, MYOGLOBIN in the last 168 hours.  Invalid input(s): CK ------------------------------------------------------------------------------------------------------------------    Component Value Date/Time   BNP 76.2 12/05/2019 2015    Micro Results Recent Results (from the past 240 hour(s))  Blood Culture (routine x 2)     Status: None   Collection Time: 12/05/19  8:00 PM   Specimen: BLOOD LEFT ARM  Result Value Ref Range Status   Specimen Description BLOOD LEFT ARM  Final   Special Requests   Final    BOTTLES DRAWN AEROBIC ONLY Blood Culture adequate volume   Culture   Final    NO GROWTH 5 DAYS Performed at [facility], [address] [place], [place]    Report Status 12/10/2019 FINAL   Final  Blood Culture (routine x 2)     Status: None   Collection Time: 12/05/19  8:16 PM   Specimen: BLOOD RIGHT ARM  Result Value Ref Range Status   Specimen Description BLOOD RIGHT ARM  Final   Special Requests   Final    BOTTLES DRAWN AEROBIC AND ANAEROBIC Blood Culture adequate volume   Culture   Final    NO GROWTH 5 DAYS Performed at [facility], [address], [place], [place]    Report Status 12/10/2019 FINAL  Final    Radiology Reports DG Chest 1 View  Result Date: 12/08/2019 CLINICAL DATA:  Dyspnea EXAM: CHEST  1 VIEW COMPARISON:  12/05/2019 FINDINGS: Bilateral interstitial and patchy alveolar airspace opacities. No pleural effusion or pneumothorax. Stable cardiomediastinal silhouette. No aggressive osseous lesion. IMPRESSION: Bilateral interstitial and patchy alveolar airspace opacities. Findings are concerning for multilobar pneumonia including atypical viral pneumonia. Electronically Signed   By: 12/07/2019   On: 12/08/2019 12:07   CT  ANGIO CHEST PE W OR WO CONTRAST  Result Date: 12/06/2019 CLINICAL DATA:  Elevated D-dimer. Positive for COVID-19. EXAM: CT ANGIOGRAPHY CHEST WITH CONTRAST TECHNIQUE: Multidetector CT imaging of the chest was performed using the standard protocol during bolus administration of intravenous contrast. Multiplanar CT image reconstructions and MIPs were obtained to evaluate the vascular anatomy. CONTRAST:  100mL OMNIPAQUE IOHEXOL 350 MG/ML SOLN COMPARISON:  None. FINDINGS: Cardiovascular: Heart size is normal. Coronary artery calcifications are present. Mild atherosclerotic changes are present at the aortic arch. There is no aneurysm. Great vessel origins are within normal limits. Pulmonary artery opacification is satisfactory. Patient motion degrades evaluation of distal segmental branches. No significant proximal emboli are present. Mediastinum/Nodes: Multiple prominent mediastinal lymph nodes present. High right paratracheal node  measures up to 13 mm in short axis. Prevascular nodes measure up to 8 mm. AP window and subcarinal lymph nodes are present as well. There is mild prominence of lymphoid tissue at the hila bilaterally. No significant axillary adenopathy is present. Lungs/Pleura: Diffuse multifocal airspace disease is most prominent at the lung bases there is some atelectasis is well. No significant effusion is present. There is pneumothorax. Upper Abdomen: Diffuse fatty infiltration of the liver is noted. Upper abdomen is otherwise unremarkable. Musculoskeletal: Mild degenerative changes are present in the cervical spine. No focal lytic or blastic lesions are present. Review of the MIP images confirms the above findings. IMPRESSION: 1. No evidence for pulmonary embolus. 2. Diffuse multifocal airspace disease compatible with multi lobar pneumonia. 3. Mediastinal adenopathy is likely reactive. 4. Coronary artery disease. 5. Hepatic steatosis. Electronically Signed   By: Marin Robertshristopher  Mattern M.D.   On: 12/06/2019 07:35   DG Chest Portable 1 View  Result Date: 12/05/2019 CLINICAL DATA:  Shortness of breath, COVID-19 positive on Tuesday, increasing shortness of breath the past few days. Smoker. EXAM: PORTABLE CHEST 1 VIEW COMPARISON:  Radiograph February 10, 2016 FINDINGS: Some mixed interstitial airspace opacities with basilar and peripheral predominance. Low lung volumes likely some superimposed atelectatic changes as well. Prominence of the cardiomediastinal silhouette is likely accentuated by low volumes and portable technique. Remaining cardiomediastinal contours are unremarkable. No acute osseous or soft tissue abnormality. Degenerative changes are present in the imaged spine and shoulders. Telemetry leads overlie the chest. IMPRESSION: Some mixed interstitial and airspace opacities with basilar and peripheral predominance. Findings are compatible with acute infection in the setting of COVID-19 positivity though edema could have a  similar appearance. Electronically Signed   By: Kreg ShropshirePrice  DeHay M.D.   On: 12/05/2019 18:58   US Abdomen Limited RUQ  Result Date: 11/18/2019 CLINICAL DATA:  Upper abdominal pain EXAM: ULTRASOUND ABDOMEN LIMITED RIGHT UPPER QUADRANT COMPARISON:  CT abdomen and pelvis July 17, 2019. FINDINGS: Gallbladder: No gallstones or wall thickening visualized. There is no pericholecystic fluid. No sonographic Murphy sign noted by sonographer. Common bile duct: Diameter: 2 mm. No evident intrahepatic or extrahepatic biliary duct dilatation. Liver: No focal lesion identified. Liver echogenicity is increased diffusely. Portal vein is patent on color Doppler imaging with normal direction of blood flow towards the liver. Other: There is an a cyst arising from the lateral mid right kidney measuring 1.3 x 1.2 cm, also seen on prior CT examination. IMPRESSION: 1. Diffuse increase in liver echogenicity, a finding indicative of hepatic steatosis. While no focal liver lesions are evident on this study, it must be cautioned that the sensitivity of ultrasound for detection of focal liver lesions is diminished significantly in this circumstance. 2.  Cyst arising from mid  right kidney. 3.  Study otherwise unremarkable. Electronically Signed   By: Bretta Bang III M.D.   On: 11/18/2019 09:29

## 2019-12-12 LAB — C-REACTIVE PROTEIN: CRP: 1.1 mg/dL — ABNORMAL HIGH (ref <1.0)

## 2019-12-12 LAB — COMPREHENSIVE METABOLIC PANEL
ALT: 40 U/L (ref 0–44)
AST: 26 U/L (ref 15–41)
Albumin: 3.2 g/dL — ABNORMAL LOW (ref 3.5–5.0)
Alkaline Phosphatase: 67 U/L (ref 38–126)
Anion gap: 9 (ref 5–15)
BUN: 24 mg/dL — ABNORMAL HIGH (ref 8–23)
CO2: 30 mmol/L (ref 22–32)
Calcium: 8.6 mg/dL — ABNORMAL LOW (ref 8.9–10.3)
Chloride: 90 mmol/L — ABNORMAL LOW (ref 98–111)
Creatinine, Ser: 0.84 mg/dL (ref 0.61–1.24)
GFR calc Af Amer: >60 mL/min (ref >60)
GFR calc non Af Amer: >60 mL/min (ref >60)
Glucose, Bld: 166 mg/dL — ABNORMAL HIGH (ref 70–99)
Potassium: 5 mmol/L (ref 3.5–5.1)
Sodium: 129 mmol/L — ABNORMAL LOW (ref 135–145)
Total Bilirubin: 1.1 mg/dL (ref 0.3–1.2)
Total Protein: 6.4 g/dL — ABNORMAL LOW (ref 6.5–8.1)

## 2019-12-12 LAB — CBC
HCT: 47.2 % (ref 39.0–52.0)
Hemoglobin: 15.7 g/dL (ref 13.0–17.0)
MCH: 29.5 pg (ref 26.0–34.0)
MCHC: 33.3 g/dL (ref 30.0–36.0)
MCV: 88.7 fL (ref 80.0–100.0)
Platelets: 358 10*3/uL (ref 150–400)
RBC: 5.32 MIL/uL (ref 4.22–5.81)
RDW: 13.4 % (ref 11.5–15.5)
WBC: 18.5 10*3/uL — ABNORMAL HIGH (ref 4.0–10.5)
nRBC: 0 % (ref 0.0–0.2)

## 2019-12-12 LAB — GLUCOSE, CAPILLARY
Glucose-Capillary: 124 mg/dL — ABNORMAL HIGH (ref 70–99)
Glucose-Capillary: 131 mg/dL — ABNORMAL HIGH (ref 70–99)
Glucose-Capillary: 119 mg/dL — ABNORMAL HIGH (ref 70–99)
Glucose-Capillary: 218 mg/dL — ABNORMAL HIGH (ref 70–99)

## 2019-12-12 LAB — FERRITIN: Ferritin: 649 ng/mL — ABNORMAL HIGH (ref 24–336)

## 2019-12-12 LAB — D-DIMER, QUANTITATIVE: D-Dimer, Quant: 0.79 ug/mL-FEU — ABNORMAL HIGH (ref 0.00–0.50)

## 2019-12-12 MED ORDER — FUROSEMIDE 10 MG/ML IJ SOLN
20.0000 mg | Freq: Once | INTRAMUSCULAR | Status: AC
  Administered 2019-12-12: 13:00:00 20 mg via INTRAVENOUS
  Filled 2019-12-12: qty 2

## 2019-12-12 MED ORDER — DEXAMETHASONE SODIUM PHOSPHATE 4 MG/ML IJ SOLN
4.0000 mg | INTRAMUSCULAR | Status: DC
  Administered 2019-12-12: 16:00:00 4 mg via INTRAVENOUS
  Filled 2019-12-12: qty 1

## 2019-12-12 MED ORDER — DOCUSATE SODIUM 100 MG PO CAPS
100.0000 mg | ORAL_CAPSULE | Freq: Two times a day (BID) | ORAL | Status: DC | PRN
  Administered 2019-12-12: 100 mg via ORAL
  Filled 2019-12-12: qty 1

## 2019-12-12 NOTE — Progress Notes (Signed)
Ambulated in room with patient for a distance totaling approximately 80'.  Pt required 10L HFNC to maintain saturations.  Tolerated well.

## 2019-12-12 NOTE — Progress Notes (Signed)
Ambulated with patient in hallway for a distance of approximately 150' on 10L HFNC.  Front wheel walker utilized.  Patient tolerated well.

## 2019-12-12 NOTE — Progress Notes (Signed)
PROGRESS NOTE                                                                                                                                                                                                             Patient Demographics:    Manuel Castillo, is a 74 y.o. male, DOB - 04-10-1946, UJW:[id number]  Outpatient Primary MD for the patient is [facility]   Admit date - 12/05/2019   LOS - 7  Chief Complaint  Patient presents with  . covid symptoms  . Shortness of Breath       Brief Narrative: Patient is a 74 y.o. male with PMHx of hypertension, asthma-who presented with 4-day history of shortness of breath, subjective fevers-he was found to have acute hypoxic respiratory failure secondary to COVID-19 pneumonia.  Patient was diagnosed with COVID-19 on 1/12 at his PCPs office   Subjective:   Slowly improving-was down to 7-8 L of oxygen this morning.  Per nursing staff requiring around 10 L of oxygen with ambulation.   Assessment  & Plan :   Acute hypoxic respiratory failure secondary to COVID-19 pneumonia: Slowly improving-FiO2 decreased to 7-8 L today.  Has completed remdesivir-s remains on steroids-CRP continues to downtrend.  CTA chest negative for PE on 1/17.  Continue supportive care and continue attempts to decrease FiO2.  Fever: afebrile  O2 requirements:  SpO2: 91 % O2 Flow Rate (L/min): 8 L/min   COVID-19 Labs: Recent Labs    12/10/19 0430 12/11/19 0032 12/12/19 0316  DDIMER 0.94* 1.12* 0.79*  FERRITIN 878* 691* 649*  CRP 4.4* 2.5* 1.1*       Component Value Date/Time   BNP 76.2 12/05/2019 2015    Recent Labs  Lab 12/05/19 2015  PROCALCITON 0.76    No results found for: SARSCOV2NAA   COVID-19 Medications: Steroids:1/16>> Remdesivir: 1/16>> 1/20 Actemra: 1/17 x 1  Other medications: Diuretics:Euvolemic-Lasix 20 mg IV x1 dose today to maintain  negative balance (-4.5 L so far). Antibiotics:Not needed as no evidence of bacterial infection  Prone/Incentive Spirometry: encouraged  encourage incentive spirometry use 3-4/hour.  DVT Prophylaxis  : Lovenox@twice  daily dosing given severity of hypoxemia.  Leukocytosis: Likely from steroids-no indication of infection given clinical improvement.  Follow-start tapering steroids.  DM-2 (A1c 7.2): Appears to be a new diagnosis-continue SSI-May need Metformin on discharge.  HTN: BP controlled-losartan on hold.  Hyponatremia: Mild-stable for close follow-up without any work-up.  Asthma: Continue bronchodilators-no signs of exacerbation.  GERD: Continue Pepcid  Anxiety: Continue low-dose Xanax as needed.  Obesity: Estimated body mass index is 41.81 kg/m as calculated from the following:   Height as of this encounter: 5\' 8"  (1.727 m).   Weight as of this encounter: 124.7 kg.    Consults  :  None  Procedures  :  None  ABG: No results found for: PHART, PCO2ART, PO2ART, HCO3, TCO2, ACIDBASEDEF, O2SAT  Vent Settings: N/A  Condition - Extremely Guarded  Family Communication  :  Daughter updated over the phone on 1/23  Code Status :  Full Code  Diet :  Diet Order            Diet Heart Room service appropriate? Yes; Fluid consistency: Thin  Diet effective now               Disposition Plan  :  Remain hospitalized-probably will require home health services on discharge  Barriers to discharge: Hypoxia requiring O2 supplementation  Antimicorbials  :    Anti-infectives (From admission, onward)   Start     Dose/Rate Route Frequency Ordered Stop   12/06/19 1000  remdesivir 100 mg in sodium chloride 0.9 % 100 mL IVPB     100 mg 200 mL/hr over 30 Minutes Intravenous Daily 12/05/19 2137 12/09/19 1145   12/05/19 2230  remdesivir 200 mg in sodium chloride 0.9% 250 mL IVPB     200 mg 580 mL/hr over 30 Minutes Intravenous Once 12/05/19 2137 12/06/19 0000      Inpatient  Medications  Scheduled Meds: . aspirin EC  81 mg Oral Daily  . chlorpheniramine-HYDROcodone  5 mL Oral Q12H  . dexamethasone (DECADRON) injection  6 mg Intravenous Q24H  . enoxaparin (LOVENOX) injection  0.5 mg/kg Subcutaneous Q12H  . famotidine  20 mg Oral BID  . fluticasone  1-2 spray Each Nare Daily  . insulin aspart  0-9 Units Subcutaneous TID WC  . mometasone-formoterol  2 puff Inhalation BID  . sodium chloride flush  3 mL Intravenous Once   Continuous Infusions: PRN Meds:.acetaminophen, albuterol, ALPRAZolam, guaiFENesin-dextromethorphan, sodium chloride  Time Spent in minutes  35   See all Orders from today for further details  12/08/19 M.D on 12/12/2019 at 11:28 AM  To page go to [email or web address] - use universal password  [facility] -  Office  [phone]    Objective:   Vitals:   12/12/19 0100 12/12/19 0416 12/12/19 0420 12/12/19 0817  BP:   (!) 119/51 (!) 136/54  Pulse:  (!) 56  64  Resp:  19  20  Temp:  97.9 F (36.6 C)  (!) 97.3 F (36.3 C)  TempSrc:  Oral  Axillary  SpO2: 94% 91%  91%  Weight:      Height:        Wt Readings from Last 3 Encounters:  12/06/19 124.7 kg     Intake/Output Summary (Last 24 hours) at 12/12/2019 1128 Last data filed at 12/12/2019 0400 Gross per 24 hour  Intake 720 ml  Output 2135 ml  Net -1415 ml     Physical Exam Gen Exam:Alert awake-not in any distress HEENT:atraumatic, normocephalic Chest: B/L clear to auscultation anteriorly CVS:S1S2 regular Abdomen:soft non tender, non distended Extremities:no edema Neurology: Non focal Skin: no rash   Data Review:    CBC Recent Labs  Lab 12/05/19 1857 12/05/19 1857 12/06/19 0258 12/09/19 0345  12/10/19 0430 12/11/19 0032 12/12/19 0316  WBC 17.4*   < > 16.0* 9.1 11.8* 15.4* 18.5*  HGB 15.8   < > 14.4 14.2 15.4 15.3 15.7  HCT 49.5   < > 43.8 44.3 46.8 46.8 47.2  PLT 235   < > 239 320 364 374 358  MCV 93.0   < > 90.3 91.0 88.8 90.2 88.7  MCH 29.7    < > 29.7 29.2 29.2 29.5 29.5  MCHC 31.9   < > 32.9 32.1 32.9 32.7 33.3  RDW 13.5   < > 13.6 13.5 13.3 13.4 13.4  LYMPHSABS 0.8  --  0.6*  --   --   --   --   MONOABS 1.5*  --  0.5  --   --   --   --   EOSABS 0.1  --  0.0  --   --   --   --   BASOSABS 0.1  --  0.0  --   --   --   --    < > = values in this interval not displayed.    Chemistries  Recent Labs  Lab 12/08/19 0130 12/09/19 0345 12/10/19 0430 12/11/19 0032 12/12/19 0316  NA 135 132* 132* 132* 129*  K 4.7 5.0 4.9 4.7 5.0  CL 96* 93* 91* 91* 90*  CO2 28 27 30 29 30   GLUCOSE 168* 164* 167* 202* 166*  BUN 26* 23 21 25* 24*  CREATININE 0.72 0.72 0.71 0.84 0.84  CALCIUM 8.5* 8.1* 8.3* 8.4* 8.6*  AST 40 38 30 29 26   ALT 43 47* 43 43 40  ALKPHOS 90 74 72 72 67  BILITOT 0.6 1.0 0.9 0.7 1.1   ------------------------------------------------------------------------------------------------------------------ No results for input(s): CHOL, HDL, LDLCALC, TRIG, CHOLHDL, LDLDIRECT in the last 72 hours.  Lab Results  Component Value Date   HGBA1C 7.2 (H) 12/07/2019   ------------------------------------------------------------------------------------------------------------------ No results for input(s): TSH, T4TOTAL, T3FREE, THYROIDAB in the last 72 hours.  Invalid input(s): FREET3 ------------------------------------------------------------------------------------------------------------------ Recent Labs    12/11/19 0032 12/12/19 0316  FERRITIN 691* 649*    Coagulation profile No results for input(s): INR, PROTIME in the last 168 hours.  Recent Labs    12/11/19 0032 12/12/19 0316  DDIMER 1.12* 0.79*    Cardiac Enzymes No results for input(s): CKMB, TROPONINI, MYOGLOBIN in the last 168 hours.  Invalid input(s): CK ------------------------------------------------------------------------------------------------------------------    Component Value Date/Time   BNP 76.2 12/05/2019 2015    Micro  Results Recent Results (from the past 240 hour(s))  Blood Culture (routine x 2)     Status: None   Collection Time: 12/05/19  8:00 PM   Specimen: BLOOD LEFT ARM  Result Value Ref Range Status   Specimen Description BLOOD LEFT ARM  Final   Special Requests   Final    BOTTLES DRAWN AEROBIC ONLY Blood Culture adequate volume   Culture   Final    NO GROWTH 5 DAYS Performed at [facility], [address] [place], [place] [address]    Report Status 12/10/2019 FINAL  Final  Blood Culture (routine x 2)     Status: None   Collection Time: 12/05/19  8:16 PM   Specimen: BLOOD RIGHT ARM  Result Value Ref Range Status   Specimen Description BLOOD RIGHT ARM  Final   Special Requests   Final    BOTTLES DRAWN AEROBIC AND ANAEROBIC Blood Culture adequate volume   Culture   Final    NO GROWTH  5 DAYS Performed at [facility], [address], [place], [place] [address]    Report Status 12/10/2019 FINAL  Final    Radiology Reports DG Chest 1 View  Result Date: 12/08/2019 CLINICAL DATA:  Dyspnea EXAM: CHEST  1 VIEW COMPARISON:  12/05/2019 FINDINGS: Bilateral interstitial and patchy alveolar airspace opacities. No pleural effusion or pneumothorax. Stable cardiomediastinal silhouette. No aggressive osseous lesion. IMPRESSION: Bilateral interstitial and patchy alveolar airspace opacities. Findings are concerning for multilobar pneumonia including atypical viral pneumonia. Electronically Signed   By: Elige Ko   On: 12/08/2019 12:07   CT ANGIO CHEST PE W OR WO CONTRAST  Result Date: 12/06/2019 CLINICAL DATA:  Elevated D-dimer. Positive for COVID-19. EXAM: CT ANGIOGRAPHY CHEST WITH CONTRAST TECHNIQUE: Multidetector CT imaging of the chest was performed using the standard protocol during bolus administration of intravenous contrast. Multiplanar CT image reconstructions and MIPs were obtained to evaluate the vascular anatomy. CONTRAST:  OMNIPAQUE IOHEXOL 350 MG/ML SOLN COMPARISON:   None. FINDINGS: Cardiovascular: Heart size is normal. Coronary artery calcifications are present. Mild atherosclerotic changes are present at the aortic arch. There is no aneurysm. Great vessel origins are within normal limits. Pulmonary artery opacification is satisfactory. Patient motion degrades evaluation of distal segmental branches. No significant proximal emboli are present. Mediastinum/Nodes: Multiple prominent mediastinal lymph nodes present. High right paratracheal node measures up to 13 mm in short axis. Prevascular nodes measure up to 8 mm. AP window and subcarinal lymph nodes are present as well. There is mild prominence of lymphoid tissue at the hila bilaterally. No significant axillary adenopathy is present. Lungs/Pleura: Diffuse multifocal airspace disease is most prominent at the lung bases there is some atelectasis is well. No significant effusion is present. There is pneumothorax. Upper Abdomen: Diffuse fatty infiltration of the liver is noted. Upper abdomen is otherwise unremarkable. Musculoskeletal: Mild degenerative changes are present in the cervical spine. No focal lytic or blastic lesions are present. Review of the MIP images confirms the above findings. IMPRESSION: 1. No evidence for pulmonary embolus. 2. Diffuse multifocal airspace disease compatible with multi lobar pneumonia. 3. Mediastinal adenopathy is likely reactive. 4. Coronary artery disease. 5. Hepatic steatosis. Electronically Signed   By: Marin Roberts M.D.   On: 12/06/2019 07:35   DG Chest Portable 1 View  Result Date: 12/05/2019 CLINICAL DATA:  Shortness of breath, COVID-19 positive on Tuesday, increasing shortness of breath the past few days. Smoker. EXAM: PORTABLE CHEST 1 VIEW COMPARISON:  Radiograph February 10, 2016 FINDINGS: Some mixed interstitial airspace opacities with basilar and peripheral predominance. Low lung volumes likely some superimposed atelectatic changes as well. Prominence of the cardiomediastinal  silhouette is likely accentuated by low volumes and portable technique. Remaining cardiomediastinal contours are unremarkable. No acute osseous or soft tissue abnormality. Degenerative changes are present in the imaged spine and shoulders. Telemetry leads overlie the chest. IMPRESSION: Some mixed interstitial and airspace opacities with basilar and peripheral predominance. Findings are compatible with acute infection in the setting of COVID-19 positivity though edema could have a similar appearance. Electronically Signed   By: Kreg Shropshire M.D.   On: 12/05/2019 18:58   US Abdomen Limited RUQ  Result Date: 11/18/2019 CLINICAL DATA:  Upper abdominal pain EXAM: ULTRASOUND ABDOMEN LIMITED RIGHT UPPER QUADRANT COMPARISON:  CT abdomen and pelvis July 17, 2019. FINDINGS: Gallbladder: No gallstones or wall thickening visualized. There is no pericholecystic fluid. No sonographic Murphy sign noted by sonographer. Common bile duct: Diameter: 2 mm. No evident intrahepatic or extrahepatic biliary  duct dilatation. Liver: No focal lesion identified. Liver echogenicity is increased diffusely. Portal vein is patent on color Doppler imaging with normal direction of blood flow towards the liver. Other: There is an a cyst arising from the lateral mid right kidney measuring 1.3 x 1.2 cm, also seen on prior CT examination. IMPRESSION: 1. Diffuse increase in liver echogenicity, a finding indicative of hepatic steatosis. While no focal liver lesions are evident on this study, it must be cautioned that the sensitivity of ultrasound for detection of focal liver lesions is diminished significantly in this circumstance. 2.  Cyst arising from mid right kidney. 3.  Study otherwise unremarkable. Electronically Signed   By: Bretta BangWilliam  Woodruff III M.D.   On: 11/18/2019 09:29

## 2019-12-12 NOTE — Progress Notes (Signed)
   12/12/19 1224  Family/Significant Other Communication  Family/Significant Other Update Called;Updated (daughter, Lorina Rabon)

## 2019-12-13 LAB — GLUCOSE, CAPILLARY
Glucose-Capillary: 117 mg/dL — ABNORMAL HIGH (ref 70–99)
Glucose-Capillary: 152 mg/dL — ABNORMAL HIGH (ref 70–99)
Glucose-Capillary: 162 mg/dL — ABNORMAL HIGH (ref 70–99)
Glucose-Capillary: 182 mg/dL — ABNORMAL HIGH (ref 70–99)

## 2019-12-13 LAB — CBC
HCT: 47.9 % (ref 39.0–52.0)
Hemoglobin: 15.9 g/dL (ref 13.0–17.0)
MCH: 29.5 pg (ref 26.0–34.0)
MCHC: 33.2 g/dL (ref 30.0–36.0)
MCV: 88.9 fL (ref 80.0–100.0)
Platelets: 371 10*3/uL (ref 150–400)
RBC: 5.39 MIL/uL (ref 4.22–5.81)
RDW: 13.4 % (ref 11.5–15.5)
WBC: 18.5 10*3/uL — ABNORMAL HIGH (ref 4.0–10.5)
nRBC: 0 % (ref 0.0–0.2)

## 2019-12-13 LAB — COMPREHENSIVE METABOLIC PANEL
ALT: 57 U/L — ABNORMAL HIGH (ref 0–44)
AST: 35 U/L (ref 15–41)
Albumin: 3.4 g/dL — ABNORMAL LOW (ref 3.5–5.0)
Alkaline Phosphatase: 67 U/L (ref 38–126)
Anion gap: 12 (ref 5–15)
BUN: 20 mg/dL (ref 8–23)
CO2: 28 mmol/L (ref 22–32)
Calcium: 8.7 mg/dL — ABNORMAL LOW (ref 8.9–10.3)
Chloride: 91 mmol/L — ABNORMAL LOW (ref 98–111)
Creatinine, Ser: 0.92 mg/dL (ref 0.61–1.24)
GFR calc Af Amer: >60 mL/min (ref >60)
GFR calc non Af Amer: >60 mL/min (ref >60)
Glucose, Bld: 122 mg/dL — ABNORMAL HIGH (ref 70–99)
Potassium: 4.4 mmol/L (ref 3.5–5.1)
Sodium: 131 mmol/L — ABNORMAL LOW (ref 135–145)
Total Bilirubin: 1.6 mg/dL — ABNORMAL HIGH (ref 0.3–1.2)
Total Protein: 6.4 g/dL — ABNORMAL LOW (ref 6.5–8.1)

## 2019-12-13 LAB — FERRITIN: Ferritin: 799 ng/mL — ABNORMAL HIGH (ref 24–336)

## 2019-12-13 LAB — D-DIMER, QUANTITATIVE: D-Dimer, Quant: 0.64 ug/mL-FEU — ABNORMAL HIGH (ref 0.00–0.50)

## 2019-12-13 LAB — C-REACTIVE PROTEIN: CRP: 0.8 mg/dL (ref <1.0)

## 2019-12-13 MED ORDER — FUROSEMIDE 10 MG/ML IJ SOLN
40.0000 mg | Freq: Once | INTRAMUSCULAR | Status: AC
  Administered 2019-12-13: 40 mg via INTRAVENOUS
  Filled 2019-12-13: qty 4

## 2019-12-13 MED ORDER — DEXAMETHASONE SODIUM PHOSPHATE 4 MG/ML IJ SOLN
3.0000 mg | INTRAMUSCULAR | Status: DC
  Administered 2019-12-13: 18:00:00 3 mg via INTRAVENOUS
  Filled 2019-12-13: qty 1

## 2019-12-13 NOTE — Plan of Care (Signed)
Dr. Truitt Merle of patient request to leave IV out and gave verbal orders to please place new line since may need lasix and in case of emergency.

## 2019-12-13 NOTE — Progress Notes (Signed)
VAST consulted to place PIV. Spoke with pt's nurse Molli Hazard to determine if meds could be changed to PO. Molli Hazard, RN reported he asked physician this morning if meds could be changed and was told "no" pt needs IV doses of Decadron and Lasix. At this time, IV Lasix has not been ordered. VAST RN advised someone will be there as soon as possible to place IV access.

## 2019-12-13 NOTE — Progress Notes (Signed)
Physical Therapy Treatment Patient Details Name: Manuel Castillo. MRN: [id number] DOB: [birth year] Today's Date: 12/13/2019    History of Present Illness 74 year old male admitted iwth Pneumonia due to Covid 19. PMH includes HTN and asthma.    PT Comments    Very pleasant and cooperative. Per RN now on 2 LPM, [place]- but he continues to desat very easily- With min exertion, including short distance ambulation (ie 25-35 feet), drops to 72-74%. He was able to exercise for IS and Flutter- with close monitoring and cues for proper breathing techniques- but fluctuating between 84-92%+, and still rending to recover in 3-4 minutes. Should continue to benefit from PT to further promote progression in goals to allow to go home.   Follow Up Recommendations        Equipment Recommendations  None recommended by PT    Recommendations for Other Services       Precautions / Restrictions Precautions Precautions: Fall;Other (comment) Precaution Comments: Monitor SpO2, pt desats Restrictions Weight Bearing Restrictions: No    Mobility  Bed Mobility Overal bed mobility: Independent;Modified Independent             General bed mobility comments: Pt seated in bedside chair upon PT arrival  Transfers Overall transfer level: Needs assistance Equipment used: Rolling walker (2 wheeled) Transfers: Sit to/from [employer] Transfers   Stand pivot transfers: Supervision;Min guard       General transfer comment: to ensure balance and safety  Ambulation/Gait Ambulation/Gait assistance: Supervision   Assistive device: Rolling walker (2 wheeled) Gait Pattern/deviations: Wide base of support;Shuffle         Stairs             Wheelchair Mobility    Modified Rankin (Stroke Patients Only)       Balance Overall balance assessment: Mild deficits observed, not formally tested                                          Cognition Arousal/Alertness:  Awake/alert Behavior During Therapy: WFL for tasks assessed/performed Overall Cognitive Status: Within Functional Limits for tasks assessed                                 General Comments: He is very HOH, Has bilateral hearing aides      Exercises Total Joint Exercises Ankle Circles/Pumps: Seated;AROM Quad Sets: AROM;Seated Short Arc Quad: AROM;Seated Long Arc Quad: AROM;Seated Knee Flexion: AROM;Seated Marching in Standing: AROM;Seated Other Exercises Other Exercises: Incentive spirometer x 10 with min cues on technique. Pulling 1250 Other Exercises: Flutter valve x 10 with min cues on technique.    General Comments General comments (skin integrity, edema, etc.): Currently on 2 LPM, [place] and at rest- 93%++      Pertinent Vitals/Pain Pain Assessment: No/denies pain    Home Living Family/patient expects to be discharged to:: Private residence Living Arrangements: Spouse/significant other Available Help at Discharge: Family Type of Home: House Home Access: Stairs to enter            Prior Function            PT Goals (current goals can now be found in the care plan section) Acute Rehab PT Goals Patient Stated Goal: To go home PT Goal Formulation: With patient Time For Goal Achievement: 12/23/19 Progress towards PT goals:  Progressing toward goals(Still hindered by desatting with min exertion)    Frequency    Min 3X/week      PT Plan      Co-evaluation              AM-PAC PT "6 Clicks" Mobility   Outcome Measure  Help needed turning from your back to your side while in a flat bed without using bedrails?: None Help needed moving from lying on your back to sitting on the side of a flat bed without using bedrails?: None Help needed moving to and from a bed to a chair (including a wheelchair)?: A Little Help needed standing up from a chair using your arms (e.g., wheelchair or bedside chair)?: A Little   Help needed climbing 3-5 steps  with a railing? : A Little 6 Click Score: 17    End of Session     Patient left: in chair;with call bell/phone within reach Nurse Communication: Mobility status PT Visit Diagnosis: Muscle weakness (generalized) (M62.81)     Time: 5993-5701 PT Time Calculation (min) (ACUTE ONLY): 50 min  Charges:  $Gait Training: 8-22 mins $Therapeutic Exercise: 23-37 mins                    Rollen Sox, PT # [phone] CGV cell   Casandra Doffing 12/13/2019, 10:59 AM

## 2019-12-13 NOTE — Progress Notes (Signed)
PROGRESS NOTE                                                                                                                                                                                                             Patient Demographics:    Manuel Castillo, is a 74 y.o. male, DOB - 1946-06-05, RKY:[id number]  Outpatient Primary MD for the patient is [facility]   Admit date - 12/05/2019   LOS - 8  Chief Complaint  Patient presents with  . covid symptoms  . Shortness of Breath       Brief Narrative: Patient is a 74 y.o. male with PMHx of hypertension, asthma-who presented with 4-day history of shortness of breath, subjective fevers-he was found to have acute hypoxic respiratory failure secondary to COVID-19 pneumonia.  Patient was diagnosed with COVID-19 on 1/12 at his PCPs office   Subjective:   Sitting at bedside chair this morning--feels better-Down to 2 L of oxygen at rest.   Assessment  & Plan :   Acute hypoxic respiratory failure secondary to COVID-19 pneumonia: Improved-down to 2 L of oxygen today-but requiring up to 6 L with ambulation.  CRP is normalized.  CTA chest negative for PE on 1/17.  Continue inpatient monitoring-if clinical improvement sustains in the next day or so-should be able to go home soon.  Continue tapering steroids.  Need to assess for home O2 requirement-probably will require home O2 on discharge.  Fever: afebrile  O2 requirements:  SpO2: 92 % O2 Flow Rate (L/min): 2 L/min(at rest)   COVID-19 Labs: Recent Labs    12/11/19 0032 12/12/19 0316 12/13/19 0505  DDIMER 1.12* 0.79* 0.64*  FERRITIN 691* 649* 799*  CRP 2.5* 1.1* 0.8       Component Value Date/Time   BNP 76.2 12/05/2019 2015    No results for input(s): PROCALCITON in the last 168 hours.  No results found for: SARSCOV2NAA   COVID-19 Medications: Steroids:1/16>> Remdesivir: 1/16>>  1/20 Actemra: 1/17 x 1  Other medications: Diuretics:Euvolemic-repeat Lasix 40 mg IV x1 to maintain negative balance (- 6.3 L so far). Antibiotics:Not needed as no evidence of bacterial infection  Prone/Incentive Spirometry: encouraged  encourage incentive spirometry use 3-4/hour.  DVT Prophylaxis  : Lovenox@twice  daily dosing given severity of hypoxemia.  Leukocytosis: Likely from steroids-no indication of infection given clinical  improvement.  Continue to monitor-hopeful that leukocytosis will continue to downtrend as steroid dosage titrated down.  DM-2 (A1c 7.2): Appears to be a new diagnosis-continue SSI-May need Metformin on discharge.  HTN: BP controlled-losartan on hold.  Hyponatremia: Mild-stable for close follow-up without any work-up.  Asthma: Continue bronchodilators-no signs of exacerbation.  GERD: Continue Pepcid  Anxiety: Continue low-dose Xanax as needed.  Obesity: Estimated body mass index is 41.81 kg/m as calculated from the following:   Height as of this encounter: 5\' 8"  (1.727 m).   Weight as of this encounter: 124.7 kg.    Consults  :  None  Procedures  :  None  ABG: No results found for: PHART, PCO2ART, PO2ART, HCO3, TCO2, ACIDBASEDEF, O2SAT  Vent Settings: N/A  Condition - Extremely Guarded  Family Communication  :  Daughter updated over the phone on 1/24  Code Status :  Full Code  Diet :  Diet Order            Diet Heart Room service appropriate? Yes; Fluid consistency: Thin  Diet effective now               Disposition Plan  :  Remain hospitalized-probably will require home health services on discharge  Barriers to discharge: Hypoxia requiring O2 supplementation  Antimicorbials  :    Anti-infectives (From admission, onward)   Start     Dose/Rate Route Frequency Ordered Stop   12/06/19 1000  remdesivir 100 mg in sodium chloride 0.9 % 100 mL IVPB     100 mg 200 mL/hr over 30 Minutes Intravenous Daily 12/05/19 2137 12/09/19 1145    12/05/19 2230  remdesivir 200 mg in sodium chloride 0.9% 250 mL IVPB     200 mg 580 mL/hr over 30 Minutes Intravenous Once 12/05/19 2137 12/06/19 0000      Inpatient Medications  Scheduled Meds: . aspirin EC  81 mg Oral Daily  . chlorpheniramine-HYDROcodone  5 mL Oral Q12H  . dexamethasone (DECADRON) injection  4 mg Intravenous Q24H  . enoxaparin (LOVENOX) injection  0.5 mg/kg Subcutaneous Q12H  . famotidine  20 mg Oral BID  . fluticasone  1-2 spray Each Nare Daily  . furosemide  40 mg Intravenous Once  . insulin aspart  0-9 Units Subcutaneous TID WC  . mometasone-formoterol  2 puff Inhalation BID  . sodium chloride flush  3 mL Intravenous Once   Continuous Infusions: PRN Meds:.acetaminophen, albuterol, ALPRAZolam, docusate sodium, guaiFENesin-dextromethorphan, sodium chloride  Time Spent in minutes  35   See all Orders from today for further details  12/08/19 M.D on 12/13/2019 at 1:43 PM  To page go to [email or web address] - use universal password  [facility] -  Office  [phone]    Objective:   Vitals:   12/13/19 1000 12/13/19 1049 12/13/19 1052 12/13/19 1055  BP:      Pulse: 77   79  Resp: 19   16  Temp:      TempSrc:      SpO2: 95% 92% (!) 72% 92%  Weight:      Height:        Wt Readings from Last 3 Encounters:  12/06/19 124.7 kg     Intake/Output Summary (Last 24 hours) at 12/13/2019 1343 Last data filed at 12/13/2019 12/15/2019 Gross per 24 hour  Intake --  Output 1800 ml  Net -1800 ml     Physical Exam Gen Exam:Alert awake-not in any distress HEENT:atraumatic, normocephalic Chest: B/L clear to auscultation anteriorly CVS:S1S2 regular Abdomen:soft  non tender, non distended Extremities:no edema Neurology: Non focal Skin: no rash   Data Review:    CBC Recent Labs  Lab 12/09/19 0345 12/10/19 0430 12/11/19 0032 12/12/19 0316 12/13/19 0505  WBC 9.1 11.8* 15.4* 18.5* 18.5*  HGB 14.2 15.4 15.3 15.7 15.9  HCT 44.3 46.8 46.8 47.2  47.9  PLT 320 364 374 358 371  MCV 91.0 88.8 90.2 88.7 88.9  MCH 29.2 29.2 29.5 29.5 29.5  MCHC 32.1 32.9 32.7 33.3 33.2  RDW 13.5 13.3 13.4 13.4 13.4    Chemistries  Recent Labs  Lab 12/09/19 0345 12/10/19 0430 12/11/19 0032 12/12/19 0316 12/13/19 0505  NA 132* 132* 132* 129* 131*  K 5.0 4.9 4.7 5.0 4.4  CL 93* 91* 91* 90* 91*  CO2 27 30 29 30 28   GLUCOSE 164* 167* 202* 166* 122*  BUN 23 21 25* 24* 20  CREATININE 0.72 0.71 0.84 0.84 0.92  CALCIUM 8.1* 8.3* 8.4* 8.6* 8.7*  AST 38 30 29 26  35  ALT 47* 43 43 40 57*  ALKPHOS 74 72 72 67 67  BILITOT 1.0 0.9 0.7 1.1 1.6*   ------------------------------------------------------------------------------------------------------------------ No results for input(s): CHOL, HDL, LDLCALC, TRIG, CHOLHDL, LDLDIRECT in the last 72 hours.  Lab Results  Component Value Date   HGBA1C 7.2 (H) 12/07/2019   ------------------------------------------------------------------------------------------------------------------ No results for input(s): TSH, T4TOTAL, T3FREE, THYROIDAB in the last 72 hours.  Invalid input(s): FREET3 ------------------------------------------------------------------------------------------------------------------ Recent Labs    12/12/19 0316 12/13/19 0505  FERRITIN 649* 799*    Coagulation profile No results for input(s): INR, PROTIME in the last 168 hours.  Recent Labs    12/12/19 0316 12/13/19 0505  DDIMER 0.79* 0.64*    Cardiac Enzymes No results for input(s): CKMB, TROPONINI, MYOGLOBIN in the last 168 hours.  Invalid input(s): CK ------------------------------------------------------------------------------------------------------------------    Component Value Date/Time   BNP 76.2 12/05/2019 2015    Micro Results Recent Results (from the past 240 hour(s))  Blood Culture (routine x 2)     Status: None   Collection Time: 12/05/19  8:00 PM   Specimen: BLOOD LEFT ARM  Result Value Ref Range  Status   Specimen Description BLOOD LEFT ARM  Final   Special Requests   Final    BOTTLES DRAWN AEROBIC ONLY Blood Culture adequate volume   Culture   Final    NO GROWTH 5 DAYS Performed at [facility], [address] [place], [place] [address]    Report Status 12/10/2019 FINAL  Final  Blood Culture (routine x 2)     Status: None   Collection Time: 12/05/19  8:16 PM   Specimen: BLOOD RIGHT ARM  Result Value Ref Range Status   Specimen Description BLOOD RIGHT ARM  Final   Special Requests   Final    BOTTLES DRAWN AEROBIC AND ANAEROBIC Blood Culture adequate volume   Culture   Final    NO GROWTH 5 DAYS Performed at [facility], [address] [place], [place] [address]    Report Status 12/10/2019 FINAL  Final    Radiology Reports DG Chest 1 View  Result Date: 12/08/2019 CLINICAL DATA:  Dyspnea EXAM: CHEST  1 VIEW COMPARISON:  12/05/2019 FINDINGS: Bilateral interstitial and patchy alveolar airspace opacities. No pleural effusion or pneumothorax. Stable cardiomediastinal silhouette. No aggressive osseous lesion. IMPRESSION: Bilateral interstitial and patchy alveolar airspace opacities. Findings are concerning for multilobar pneumonia including atypical viral pneumonia. Electronically Signed   By: Elige KoHetal  Patel   On: 12/08/2019  12:07   CT ANGIO CHEST PE W OR WO CONTRAST  Result Date: 12/06/2019 CLINICAL DATA:  Elevated D-dimer. Positive for COVID-19. EXAM: CT ANGIOGRAPHY CHEST WITH CONTRAST TECHNIQUE: Multidetector CT imaging of the chest was performed using the standard protocol during bolus administration of intravenous contrast. Multiplanar CT image reconstructions and MIPs were obtained to evaluate the vascular anatomy. CONTRAST:  153mL OMNIPAQUE IOHEXOL 350 MG/ML SOLN COMPARISON:  None. FINDINGS: Cardiovascular: Heart size is normal. Coronary artery calcifications are present. Mild atherosclerotic changes are present at the aortic arch. There is no aneurysm. Great vessel  origins are within normal limits. Pulmonary artery opacification is satisfactory. Patient motion degrades evaluation of distal segmental branches. No significant proximal emboli are present. Mediastinum/Nodes: Multiple prominent mediastinal lymph nodes present. High right paratracheal node measures up to 13 mm in short axis. Prevascular nodes measure up to 8 mm. AP window and subcarinal lymph nodes are present as well. There is mild prominence of lymphoid tissue at the hila bilaterally. No significant axillary adenopathy is present. Lungs/Pleura: Diffuse multifocal airspace disease is most prominent at the lung bases there is some atelectasis is well. No significant effusion is present. There is pneumothorax. Upper Abdomen: Diffuse fatty infiltration of the liver is noted. Upper abdomen is otherwise unremarkable. Musculoskeletal: Mild degenerative changes are present in the cervical spine. No focal lytic or blastic lesions are present. Review of the MIP images confirms the above findings. IMPRESSION: 1. No evidence for pulmonary embolus. 2. Diffuse multifocal airspace disease compatible with multi lobar pneumonia. 3. Mediastinal adenopathy is likely reactive. 4. Coronary artery disease. 5. Hepatic steatosis. Electronically Signed   By: San Morelle M.D.   On: 12/06/2019 07:35   DG Chest Portable 1 View  Result Date: 12/05/2019 CLINICAL DATA:  Shortness of breath, COVID-19 positive on Tuesday, increasing shortness of breath the past few days. Smoker. EXAM: PORTABLE CHEST 1 VIEW COMPARISON:  Radiograph February 10, 2016 FINDINGS: Some mixed interstitial airspace opacities with basilar and peripheral predominance. Low lung volumes likely some superimposed atelectatic changes as well. Prominence of the cardiomediastinal silhouette is likely accentuated by low volumes and portable technique. Remaining cardiomediastinal contours are unremarkable. No acute osseous or soft tissue abnormality. Degenerative changes  are present in the imaged spine and shoulders. Telemetry leads overlie the chest. IMPRESSION: Some mixed interstitial and airspace opacities with basilar and peripheral predominance. Findings are compatible with acute infection in the setting of COVID-19 positivity though edema could have a similar appearance. Electronically Signed   By: Lovena Le M.D.   On: 12/05/2019 18:58   US Abdomen Limited RUQ  Result Date: 11/18/2019 CLINICAL DATA:  Upper abdominal pain EXAM: ULTRASOUND ABDOMEN LIMITED RIGHT UPPER QUADRANT COMPARISON:  CT abdomen and pelvis July 17, 2019. FINDINGS: Gallbladder: No gallstones or wall thickening visualized. There is no pericholecystic fluid. No sonographic Murphy sign noted by sonographer. Common bile duct: Diameter: 2 mm. No evident intrahepatic or extrahepatic biliary duct dilatation. Liver: No focal lesion identified. Liver echogenicity is increased diffusely. Portal vein is patent on color Doppler imaging with normal direction of blood flow towards the liver. Other: There is an a cyst arising from the lateral mid right kidney measuring 1.3 x 1.2 cm, also seen on prior CT examination. IMPRESSION: 1. Diffuse increase in liver echogenicity, a finding indicative of hepatic steatosis. While no focal liver lesions are evident on this study, it must be cautioned that the sensitivity of ultrasound for detection of focal liver lesions is diminished significantly in this circumstance. 2.  Cyst arising from mid right kidney. 3.  Study otherwise unremarkable. Electronically Signed   By: Bretta Bang III M.D.   On: 11/18/2019 09:29

## 2019-12-13 NOTE — Discharge Planning (Signed)
Patient maintaining 90% or better on 2LNC at rest. With activity SPO2 fell into low 70s on [facility]  With activity patient needed 6LNC to recover& maintain at 90%. If discharged, patient will need O2 at home for rest and activity

## 2019-12-14 LAB — COMPREHENSIVE METABOLIC PANEL
ALT: 59 U/L — ABNORMAL HIGH (ref 0–44)
AST: 30 U/L (ref 15–41)
Albumin: 3.2 g/dL — ABNORMAL LOW (ref 3.5–5.0)
Alkaline Phosphatase: 65 U/L (ref 38–126)
Anion gap: 11 (ref 5–15)
BUN: 22 mg/dL (ref 8–23)
CO2: 27 mmol/L (ref 22–32)
Calcium: 8.3 mg/dL — ABNORMAL LOW (ref 8.9–10.3)
Chloride: 92 mmol/L — ABNORMAL LOW (ref 98–111)
Creatinine, Ser: 0.79 mg/dL (ref 0.61–1.24)
GFR calc Af Amer: >60 mL/min (ref >60)
GFR calc non Af Amer: >60 mL/min (ref >60)
Glucose, Bld: 133 mg/dL — ABNORMAL HIGH (ref 70–99)
Potassium: 4.4 mmol/L (ref 3.5–5.1)
Sodium: 130 mmol/L — ABNORMAL LOW (ref 135–145)
Total Bilirubin: 1.3 mg/dL — ABNORMAL HIGH (ref 0.3–1.2)
Total Protein: 6.1 g/dL — ABNORMAL LOW (ref 6.5–8.1)

## 2019-12-14 LAB — CBC
HCT: 47.1 % (ref 39.0–52.0)
Hemoglobin: 16.2 g/dL (ref 13.0–17.0)
MCH: 30.1 pg (ref 26.0–34.0)
MCHC: 34.4 g/dL (ref 30.0–36.0)
MCV: 87.5 fL (ref 80.0–100.0)
Platelets: 309 10*3/uL (ref 150–400)
RBC: 5.38 MIL/uL (ref 4.22–5.81)
RDW: 13.7 % (ref 11.5–15.5)
WBC: 16 10*3/uL — ABNORMAL HIGH (ref 4.0–10.5)
nRBC: 0 % (ref 0.0–0.2)

## 2019-12-14 LAB — FERRITIN: Ferritin: 827 ng/mL — ABNORMAL HIGH (ref 24–336)

## 2019-12-14 LAB — GLUCOSE, CAPILLARY
Glucose-Capillary: 108 mg/dL — ABNORMAL HIGH (ref 70–99)
Glucose-Capillary: 96 mg/dL (ref 70–99)
Glucose-Capillary: 127 mg/dL — ABNORMAL HIGH (ref 70–99)

## 2019-12-14 LAB — C-REACTIVE PROTEIN: CRP: 0.6 mg/dL (ref <1.0)

## 2019-12-14 LAB — D-DIMER, QUANTITATIVE: D-Dimer, Quant: 0.66 ug/mL-FEU — ABNORMAL HIGH (ref 0.00–0.50)

## 2019-12-14 MED ORDER — METFORMIN HCL 500 MG PO TABS: 500.0000 mg | ORAL_TABLET | Freq: Two times a day (BID) | ORAL | 0 refills | Status: DC

## 2019-12-14 MED ORDER — FUROSEMIDE 10 MG/ML IJ SOLN
40.0000 mg | Freq: Once | INTRAMUSCULAR | Status: AC
  Administered 2019-12-14: 40 mg via INTRAVENOUS
  Filled 2019-12-14: qty 4

## 2019-12-14 MED ORDER — BLOOD GLUCOSE METER KIT: PACK | 0 refills | Status: DC

## 2019-12-14 MED ORDER — DEXAMETHASONE 2 MG PO TABS: 2.0000 mg | ORAL_TABLET | Freq: Every day | ORAL | 0 refills | Status: DC

## 2019-12-14 NOTE — Discharge Planning (Signed)
SATURATION QUALIFICATIONS: (This note is used to comply with regulatory documentation for home oxygen)  Patient Saturations on Room Air at Rest = 88%  Patient Saturations on Room Air while Ambulating = 77%  Patient Saturations on 5Liters of oxygen while Ambulating = 90%  Please briefly explain why patient needs home oxygen: Cannot maintain Spo2 at 90% or greeater without o2 support.

## 2019-12-14 NOTE — TOC Progression Note (Signed)
Transition of Care (TOC) - Progression Note    Patient Details  Name: Manuel Castillo. MRN: [id number] Date of Birth: November 23, 1945  Transition of Care [facility]) CM/SW Contact  Armanda Heritage, RN Phone Number: 12/14/2019, 11:48 AM  Clinical Narrative:    Patient is set up for home oxygen with [employer], AC to provide portable tank for dc home.  Christoper Allegra will provide dme 3in1 for home use. AC to provide that to patient.  Patient's spouse or daughter will be picking patient up from hospital at dc.  CM is awaiting confirmation that concentrator has been delivered to home, once confirmation is received pt can dc home.   Expected Discharge Plan: Home/Self Care Barriers to Discharge: No Barriers Identified  Expected Discharge Plan and Services Expected Discharge Plan: Home/Self Care   Discharge Planning Services: CM Consult Post Acute Care Choice: Durable Medical Equipment Living arrangements for the past 2 months: Single Family Home Expected Discharge Date: 12/14/19               DME Arranged: Oxygen, 3-N-1 DME Agency: [facility] Date DME Agency Contacted: 12/14/19 Time DME Agency Contacted: [phone] Representative spoke with at DME Agency: Morrie [facility] HH Arranged: NA HH Agency: NA         Social Determinants of Health (SDOH) Interventions    Readmission Risk Interventions No flowsheet data found.

## 2019-12-14 NOTE — Care Management Important Message (Signed)
Important Message  Patient Details  Name: Manuel Castillo. MRN: [id number] Date of Birth: 27-May-1946   Medicare Important Message Given:  Yes - Important Message mailed due to current National Emergency  Verbal consent obtained due to current National Emergency  Relationship to patient: Child Contact Name: Yashua Bracco Call Date: 12/14/19  Time: 1543 Phone: [phone]   Important Message mailed to: Patient address on file    Orson Aloe 12/14/2019, 3:44 PM

## 2019-12-14 NOTE — Progress Notes (Signed)
Occupational Therapy Treatment Patient Details Name: Manuel Castillo. MRN: [id number] DOB: 05-12-1946 Today's Date: 12/14/2019    History of present illness 74 year old male admitted iwth Pneumonia due to Covid 19. PMH includes HTN and asthma.   OT comments  Pt making progress. Walking ambulation completed with nsg. Educated pt on use of AE to help increase independence with ADL in addition to education on energy conservation and reducing risk of falls. Do not recommend follow up OT at this time.   Follow Up Recommendations  No OT follow up;Supervision - Intermittent    Equipment Recommendations  3 in 1 bedside commode(to use as shower chair)    Recommendations for Other Services      Precautions / Restrictions Precautions Precautions: Fall;Other (comment)       Mobility Bed Mobility               General bed mobility comments: OOB in chair  Transfers Overall transfer level: Modified independent                    Balance Overall balance assessment: Mild deficits observed, not formally tested                                         ADL either performed or assessed with clinical judgement   ADL                                         General ADL Comments: Pt currently requires A for LB ADL. Issued long handled sponge and reacher to assist with tasks. Pt ale to verbalize how to use AE. Educated on energy conservation and strategies to reduce risk of falls.     Vision       Perception     Praxis      Cognition Arousal/Alertness: Awake/alert Behavior During Therapy: WFL for tasks assessed/performed Overall Cognitive Status: Within Functional Limits for tasks assessed                                 General Comments: He is very HOH, Has bilateral hearing aides        Exercises     Shoulder Instructions       General Comments      Pertinent Vitals/ Pain       Pain Assessment: No/denies  pain  Home Living                                          Prior Functioning/Environment              Frequency  Min 3X/week        Progress Toward Goals  OT Goals(current goals can now be found in the care plan section)  Progress towards OT goals: Progressing toward goals  Acute Rehab OT Goals Patient Stated Goal: To go home Time For Goal Achievement: 12/24/19 Potential to Achieve Goals: Good ADL Goals Pt Will Perform Grooming: standing;with modified independence Pt Will Perform Lower Body Bathing: sit to/from stand;with modified independence Pt Will Perform Lower Body Dressing: sit to/from stand;with modified independence Pt Will  Transfer to Toilet: with modified independence;ambulating;regular height toilet Pt Will Perform Toileting - Clothing Manipulation and hygiene: with modified independence;sit to/from stand Additional ADL Goal #1: Pt to recall and verbalize 3 relaxation strategies with 0 verbal cues. Additional ADL Goal #2: Pt to recall and verbalize 3 energy conservation techniques with 0 verbal cues. Additional ADL Goal #3: Pt to tolerate standing up to 5 min with modified independence and SpO2 maintaining in the 90s, in preparation for ADLs.  Plan Discharge plan remains appropriate    Co-evaluation                 AM-PAC OT "6 Clicks" Daily Activity     Outcome Measure   Help from another person eating meals?: None Help from another person taking care of personal grooming?: None Help from another person toileting, which includes using toliet, bedpan, or urinal?: A Little Help from another person bathing (including washing, rinsing, drying)?: A Little Help from another person to put on and taking off regular upper body clothing?: A Little Help from another person to put on and taking off regular lower body clothing?: A Little 6 Click Score: 20    End of Session Equipment Utilized During Treatment: Oxygen  OT Visit Diagnosis:  Unsteadiness on feet (R26.81);Muscle weakness (generalized) (M62.81)   Activity Tolerance Patient tolerated treatment well   Patient Left in chair;with call bell/phone within reach   Nurse Communication Mobility status        Time: 1018-1030 OT Time Calculation (min): 12 min  Charges: OT General Charges $OT Visit: 1 Visit OT Treatments $Self Care/Home Management : 8-22 mins  Maurie Boettcher, OT/L   Acute OT Clinical Specialist [place] Pager [phone] Office [phone]    [facility] 12/14/2019, 2:47 PM

## 2019-12-14 NOTE — Discharge Planning (Signed)
Patient maintaining on 1LNC at rest  Ambulated on 1L and fell to upper 70s-low 80s. Ambulated on 5LNC and maintained upper 80s and low 90s. If discharged will need o2 at home continuous and increased for activity.

## 2019-12-14 NOTE — Discharge Planning (Signed)
IV removed.  RN assessment and VS revealed stability for DC to home with O2.  O2 delivered to home and room prior to discharge.  Discharge papers given, explained and educated.  Informed about importance of continuing self quarantine for additional 2 weeks beyond DC.  Talked about monitoring self and others in the home for increased difficulty breathing/SOB, increased weakness/lethargicness or uncontrolled fevers.  Told suggested FU with PCP.  Signed DC contract and placed in chart.  Once ready, will be wheeled to front and family transporting home via car (also with 3:1).

## 2019-12-14 NOTE — Discharge Summary (Signed)
PATIENT DETAILS Name: Manuel Castillo. Age: 74 y.o. Sex: male Date of Birth: September 06, 1946 MRN: [id number]. Admitting Physician: Jani Gravel, MD WUJ:[id number], [facility]  Admit Date: 12/05/2019 Discharge date: 12/14/2019  Recommendations for Outpatient Follow-up:  1. Follow up with PCP in 1-2 weeks 2. Please obtain CMP/CBC in one week 3. Repeat Chest Xray in 4-6 week 4. Consider repeating CT chest in 4 to 6 wks to ensure mediastinal lymphadenopathy has resolved. 5. Please reassess at next visit whether patient still requires home O2.  Admitted From:  Home  Disposition: [facility]: No  Equipment/Devices: Home O2-1 2 L at rest, 5 L with ambulation.  Discharge Condition: Stable  CODE STATUS: FULL CODE  Diet recommendation:  Diet Order            Diet - low sodium heart healthy        Diet Carb Modified        Diet Heart Room service appropriate? Yes; Fluid consistency: Thin  Diet effective now               Brief Summary: Patient is a 74 y.o. male with PMHx of hypertension, asthma-who presented with 4-day history of shortness of breath, subjective fevers-he was found to have acute hypoxic respiratory failure secondary to COVID-19 pneumonia.  Patient was diagnosed with COVID-19 on 1/12 at his PCPs office (results in care everywhere)  Brief Hospital Course: Acute hypoxic respiratory failure secondary to COVID-19 pneumonia: Significantly improved-long hospital course with severe hypoxemia-that has gradually improved over the past few days.  This morning-he was titrated down to room air-and maintains O2 saturation in the high 80s to low 90s-suspect will require either 1 or 2 L at rest, but does require up to 5 L of oxygen with ambulation.  CRP is normalized.  CTA chest negative for PE on 1/17.  Stable to be discharged home today on home O2 with close outpatient follow-up with PCP.    COVID-19 Labs:  Recent Labs    12/12/19 0316  12/13/19 0505 12/14/19 0310  DDIMER 0.79* 0.64* 0.66*  FERRITIN 649* 799* 827*  CRP 1.1* 0.8 0.6    No results found for: SARSCOV2NAA   COVID-19 Medications: COVID-19 Medications: Steroids:1/16>>1/25 Remdesivir: 1/16>> 1/20 Actemra: 1/17 x 1  Leukocytosis: Likely from steroids-no indication of infection given clinical improvement.  Leukocytosis downtrending as steroid dosage continues to get tapered down.  DM-2 (A1c 7.2): Appears to be a new diagnosis-managed with SSI-starting Metformin on discharge.  PCP to optimize further.  HTN: BP controlled-resume lisinopril on discharge.  Hyponatremia: Mild-stable for close follow-up without any work-up.  Asthma: Continue bronchodilators-no signs of exacerbation.  GERD: Continue Pepcid  Anxiety:  Stable with low-dose Xanax as needed during this hospital stay.  Mediastinal lymphadenopathy: Seen incidentally on CT chest-likely reactive-please consider repeating CT chest in 4 to 6 weeks.  If still persistent-may require referral to pulmonology.   Obesity: Estimated body mass index is 41.81 kg/m as calculated from the following:   Height as of this encounter: '5\' 8"'  (1.727 m).   Weight as of this encounter: 124.7 kg.    Procedures/Studies: None  Discharge Diagnoses:  Principal Problem:   Pneumonia due to COVID-19 virus Active Problems:   Acute respiratory failure with hypoxia ([place])   COVID-19 virus infection   Hyponatremia   Essential hypertension   GERD (gastroesophageal reflux disease)   Discharge Instructions:    Person Under Monitoring Name: Braden Cimo.  Location: [phone]  [address]  [place] [address]   Infection Prevention Recommendations for Individuals Confirmed to have, or Being Evaluated for, 2019 Novel Coronavirus (COVID-19) Infection Who Receive Care at Home  Individuals who are confirmed to have, or are being evaluated for, COVID-19 should follow the prevention steps below until a healthcare  provider or local or state health department says they can return to normal activities.  Stay home except to get medical care You should restrict activities outside your home, except for getting medical care. Do not go to work, school, or public areas, and do not use public transportation or taxis.  Call ahead before visiting your doctor Before your medical appointment, call the healthcare provider and tell them that you have, or are being evaluated for, COVID-19 infection. This will help the healthcare provider's office take steps to keep other people from getting infected. Ask your healthcare provider to call the local or state health department.  Monitor your symptoms Seek prompt medical attention if your illness is worsening (e.g., difficulty breathing). Before going to your medical appointment, call the healthcare provider and tell them that you have, or are being evaluated for, COVID-19 infection. Ask your healthcare provider to call the local or state health department.  Wear a facemask You should wear a facemask that covers your nose and mouth when you are in the same room with other people and when you visit a healthcare provider. People who live with or visit you should also wear a facemask while they are in the same room with you.  Separate yourself from other people in your home As much as possible, you should stay in a different room from other people in your home. Also, you should use a separate bathroom, if available.  Avoid sharing household items You should not share dishes, drinking glasses, cups, eating utensils, towels, bedding, or other items with other people in your home. After using these items, you should wash them thoroughly with soap and water.  Cover your coughs and sneezes Cover your mouth and nose with a tissue when you cough or sneeze, or you can cough or sneeze into your sleeve. Throw used tissues in a lined trash can, and immediately wash your hands  with soap and water for at least 20 seconds or use an alcohol-based hand rub.  Wash your [facility] your hands often and thoroughly with soap and water for at least 20 seconds. You can use an alcohol-based hand sanitizer if soap and water are not available and if your hands are not visibly dirty. Avoid touching your eyes, nose, and mouth with unwashed hands.   Prevention Steps for Caregivers and Household Members of Individuals Confirmed to have, or Being Evaluated for, COVID-19 Infection Being Cared for in the Home  If you live with, or provide care at home for, a person confirmed to have, or being evaluated for, COVID-19 infection please follow these guidelines to prevent infection:  Follow healthcare provider's instructions Make sure that you understand and can help the patient follow any healthcare provider instructions for all care.  Provide for the patient's basic needs You should help the patient with basic needs in the home and provide support for getting groceries, prescriptions, and other personal needs.  Monitor the patient's symptoms If they are getting sicker, call his or her medical provider and tell them that the patient has, or is being evaluated for, COVID-19 infection. This will help the healthcare provider's office take steps to keep other people from  getting infected. Ask the healthcare provider to call the local or state health department.  Limit the number of people who have contact with the patient  If possible, have only one caregiver for the patient.  Other household members should stay in another home or place of residence. If this is not possible, they should stay  in another room, or be separated from the patient as much as possible. Use a separate bathroom, if available.  Restrict visitors who do not have an essential need to be in the home.  Keep older adults, very young children, and other sick people away from the patient Keep older adults, very  young children, and those who have compromised immune systems or chronic health conditions away from the patient. This includes people with chronic heart, lung, or kidney conditions, diabetes, and cancer.  Ensure good ventilation Make sure that shared spaces in the home have good air flow, such as from an air conditioner or an opened window, weather permitting.  Wash your hands often  Wash your hands often and thoroughly with soap and water for at least 20 seconds. You can use an alcohol based hand sanitizer if soap and water are not available and if your hands are not visibly dirty.  Avoid touching your eyes, nose, and mouth with unwashed hands.  Use disposable paper towels to dry your hands. If not available, use dedicated cloth towels and replace them when they become wet.  Wear a facemask and gloves  Wear a disposable facemask at all times in the room and gloves when you touch or have contact with the patient's blood, body fluids, and/or secretions or excretions, such as sweat, saliva, sputum, nasal mucus, vomit, urine, or feces.  Ensure the mask fits over your nose and mouth tightly, and do not touch it during use.  Throw out disposable facemasks and gloves after using them. Do not reuse.  Wash your hands immediately after removing your facemask and gloves.  If your personal clothing becomes contaminated, carefully remove clothing and launder. Wash your hands after handling contaminated clothing.  Place all used disposable facemasks, gloves, and other waste in a lined container before disposing them with other household waste.  Remove gloves and wash your hands immediately after handling these items.  Do not share dishes, glasses, or other household items with the patient  Avoid sharing household items. You should not share dishes, drinking glasses, cups, eating utensils, towels, bedding, or other items with a patient who is confirmed to have, or being evaluated for, COVID-19  infection.  After the person uses these items, you should wash them thoroughly with soap and water.  Wash laundry thoroughly  Immediately remove and wash clothes or bedding that have blood, body fluids, and/or secretions or excretions, such as sweat, saliva, sputum, nasal mucus, vomit, urine, or feces, on them.  Wear gloves when handling laundry from the patient.  Read and follow directions on labels of laundry or clothing items and detergent. In general, wash and dry with the warmest temperatures recommended on the label.  Clean all areas the individual has used often  Clean all touchable surfaces, such as counters, tabletops, doorknobs, bathroom fixtures, toilets, phones, keyboards, tablets, and bedside tables, every day. Also, clean any surfaces that may have blood, body fluids, and/or secretions or excretions on them.  Wear gloves when cleaning surfaces the patient has come in contact with.  Use a diluted bleach solution (e.g., dilute bleach with 1 part bleach and 10 parts water) or a  household disinfectant with a label that says EPA-registered for coronaviruses. To make a bleach solution at home, add 1 tablespoon of bleach to 1 quart (4 cups) of water. For a larger supply, add  cup of bleach to 1 gallon (16 cups) of water.  Read labels of cleaning products and follow recommendations provided on product labels. Labels contain instructions for safe and effective use of the cleaning product including precautions you should take when applying the product, such as wearing gloves or eye protection and making sure you have good ventilation during use of the product.  Remove gloves and wash hands immediately after cleaning.  Monitor yourself for signs and symptoms of illness Caregivers and household members are considered close contacts, should monitor their health, and will be asked to limit movement outside of the home to the extent possible. Follow the monitoring steps for close contacts  listed on the symptom monitoring form.   ? If you have additional questions, contact your local health department or call the epidemiologist on call at [phone] (available 24/7). ? This guidance is subject to change. For the most up-to-date guidance from CDC, please refer to their website: YouBlogs.pl    Activity:  As tolerated   Discharge Instructions    Call MD for:  difficulty breathing, headache or visual disturbances   Complete by: As directed    Call MD for:  extreme fatigue   Complete by: As directed    Call MD for:  persistant dizziness or light-headedness   Complete by: As directed    Diet - low sodium heart healthy   Complete by: As directed    Diet Carb Modified   Complete by: As directed    Discharge instructions   Complete by: As directed    1.)  3 weeks of isolation from 12/01/2019  2.)  You have DM-2-you have been started on Metformin on discharge.  Please check your blood sugar levels 2-3 times a day, please keep a recording of these readings, and take it to you primary care MDs office at your next visit.  3.)  You have some slightly enlarged lymph nodes in your chest-this is likely from COVID-19 pneumonia, please ask your primary care practitioner to consider repeating a CT scan of your chest in 4 to 6 weeks.  If these lymph nodes are still enlarged on follow-up CT chest-you will need a appointment with a lung doctor.   Follow with Primary MD  [place] in 1-2 weeks   Please get a complete blood count and chemistry panel checked by your Primary MD at your next visit, and again as instructed by your Primary MD.  Get Medicines reviewed and adjusted: Please take all your medications with you for your next visit with your Primary MD  Laboratory/radiological data: Please request your Primary MD to go over all hospital tests and procedure/radiological results at the  follow up, please ask your Primary MD to get all Hospital records sent to his/her office.  In some cases, they will be blood work, cultures and biopsy results pending at the time of your discharge. Please request that your primary care M.D. follows up on these results.  Also Note the following: If you experience worsening of your admission symptoms, develop shortness of breath, life threatening emergency, suicidal or homicidal thoughts you must seek medical attention immediately by calling 911 or calling your MD immediately  if symptoms less severe.  You must read complete instructions/literature along with all the possible adverse  reactions/side effects for all the Medicines you take and that have been prescribed to you. Take any new Medicines after you have completely understood and accpet all the possible adverse reactions/side effects.   Do not drive when taking Pain medications or sleeping medications (Benzodaizepines)  Do not take more than prescribed Pain, Sleep and Anxiety Medications. It is not advisable to combine anxiety,sleep and pain medications without talking with your primary care practitioner  Special Instructions: If you have smoked or chewed Tobacco  in the last 2 yrs please stop smoking, stop any regular Alcohol  and or any Recreational drug use.  Wear Seat belts while driving.  Please note: You were cared for by a hospitalist during your hospital stay. Once you are discharged, your primary care physician will handle any further medical issues. Please note that NO REFILLS for any discharge medications will be authorized once you are discharged, as it is imperative that you return to your primary care physician (or establish a relationship with a primary care physician if you do not have one) for your post hospital discharge needs so that they can reassess your need for medications and monitor your lab values.   Increase activity slowly   Complete by: As directed       Allergies as of 12/14/2019   No Known Allergies     Medication List    STOP taking these medications   amoxicillin 875 MG tablet Commonly known as: AMOXIL   ibuprofen 200 MG tablet Commonly known as: ADVIL     TAKE these medications   albuterol 108 (90 Base) MCG/ACT inhaler Commonly known as: VENTOLIN HFA Inhale 1-2 puffs into the lungs every 6 (six) hours as needed for wheezing.   aspirin EC 81 MG tablet Take 81 mg by mouth daily.   blood glucose meter kit and supplies Dispense based on patient and insurance preference. Use up to four times daily as directed. (FOR ICD-10 E10.9, E11.9).   budesonide-formoterol 160-4.5 MCG/ACT inhaler Commonly known as: SYMBICORT Inhale 1 puff into the lungs every morning.   dexamethasone 2 MG tablet Commonly known as: DECADRON Take 1 tablet (2 mg total) by mouth daily. Start taking on: December 15, 2019   famotidine 20 MG tablet Commonly known as: PEPCID Take 20 mg by mouth 2 (two) times daily as needed (hearburn/indigestion).   fluticasone 50 MCG/ACT nasal spray Commonly known as: FLONASE Place 1-2 sprays into both nostrils daily as needed (congestion.).   lisinopril 20 MG tablet Commonly known as: ZESTRIL Take 20 mg by mouth daily.   metFORMIN 500 MG tablet Commonly known as: Glucophage Take 1 tablet (500 mg total) by mouth 2 (two) times daily with a meal.            Durable Medical Equipment  (From admission, onward)         Start     Ordered   12/14/19 1022  DME Oxygen  (Discharge Planning)  Once    Comments: Stationary concentrator and portable  Question Answer Comment  Length of Need 6 Months   Mode or (Route) Mask   Liters per Minute 5   Oxygen delivery system Gas      12/14/19 1022   12/14/19 0945  For home use only DME 3 n 1  Once     12/14/19 0944         Follow-up Information    [place]. Schedule an appointment as soon as possible for a visit in 1 week(s).  Specialty: Family Medicine Contact information: [address] [place] [address] [phone]          No Known Allergies  Consultations:   None  Other Procedures/Studies: DG Chest 1 View  Result Date: 12/08/2019 CLINICAL DATA:  Dyspnea EXAM: CHEST  1 VIEW COMPARISON:  12/05/2019 FINDINGS: Bilateral interstitial and patchy alveolar airspace opacities. No pleural effusion or pneumothorax. Stable cardiomediastinal silhouette. No aggressive osseous lesion. IMPRESSION: Bilateral interstitial and patchy alveolar airspace opacities. Findings are concerning for multilobar pneumonia including atypical viral pneumonia. Electronically Signed   By: Kathreen Devoid   On: 12/08/2019 12:07   CT ANGIO CHEST PE W OR WO CONTRAST  Result Date: 12/06/2019 CLINICAL DATA:  Elevated D-dimer. Positive for COVID-19. EXAM: CT ANGIOGRAPHY CHEST WITH CONTRAST TECHNIQUE: Multidetector CT imaging of the chest was performed using the standard protocol during bolus administration of intravenous contrast. Multiplanar CT image reconstructions and MIPs were obtained to evaluate the vascular anatomy. CONTRAST:  130m OMNIPAQUE IOHEXOL 350 MG/ML SOLN COMPARISON:  None. FINDINGS: Cardiovascular: Heart size is normal. Coronary artery calcifications are present. Mild atherosclerotic changes are present at the aortic arch. There is no aneurysm. Great vessel origins are within normal limits. Pulmonary artery opacification is satisfactory. Patient motion degrades evaluation of distal segmental branches. No significant proximal emboli are present. Mediastinum/Nodes: Multiple prominent mediastinal lymph nodes present. High right paratracheal node measures up to 13 mm in short axis. Prevascular nodes measure up to 8 mm. AP window and subcarinal lymph nodes are present as well. There is mild prominence of lymphoid tissue at the hila bilaterally. No significant axillary adenopathy is present. Lungs/Pleura: Diffuse multifocal  airspace disease is most prominent at the lung bases there is some atelectasis is well. No significant effusion is present. There is pneumothorax. Upper Abdomen: Diffuse fatty infiltration of the liver is noted. Upper abdomen is otherwise unremarkable. Musculoskeletal: Mild degenerative changes are present in the cervical spine. No focal lytic or blastic lesions are present. Review of the MIP images confirms the above findings. IMPRESSION: 1. No evidence for pulmonary embolus. 2. Diffuse multifocal airspace disease compatible with multi lobar pneumonia. 3. Mediastinal adenopathy is likely reactive. 4. Coronary artery disease. 5. Hepatic steatosis. Electronically Signed   By: CSan MorelleM.D.   On: 12/06/2019 07:35   DG Chest Portable 1 View  Result Date: 12/05/2019 CLINICAL DATA:  Shortness of breath, COVID-19 positive on Tuesday, increasing shortness of breath the past few days. Smoker. EXAM: PORTABLE CHEST 1 VIEW COMPARISON:  Radiograph February 10, 2016 FINDINGS: Some mixed interstitial airspace opacities with basilar and peripheral predominance. Low lung volumes likely some superimposed atelectatic changes as well. Prominence of the cardiomediastinal silhouette is likely accentuated by low volumes and portable technique. Remaining cardiomediastinal contours are unremarkable. No acute osseous or soft tissue abnormality. Degenerative changes are present in the imaged spine and shoulders. Telemetry leads overlie the chest. IMPRESSION: Some mixed interstitial and airspace opacities with basilar and peripheral predominance. Findings are compatible with acute infection in the setting of COVID-19 positivity though edema could have a similar appearance. Electronically Signed   By: PLovena LeM.D.   On: 12/05/2019 18:58   [place]domen Limited RUQ  Result Date: 11/18/2019 CLINICAL DATA:  Upper abdominal pain EXAM: ULTRASOUND ABDOMEN LIMITED RIGHT UPPER QUADRANT COMPARISON:  CT abdomen and pelvis July 17, 2019. FINDINGS: Gallbladder: No gallstones or wall thickening visualized. There is no pericholecystic fluid. No sonographic Murphy sign noted by sonographer. Common bile duct: Diameter: 2 mm. No  evident intrahepatic or extrahepatic biliary duct dilatation. Liver: No focal lesion identified. Liver echogenicity is increased diffusely. Portal vein is patent on color Doppler imaging with normal direction of blood flow towards the liver. Other: There is an a cyst arising from the lateral mid right kidney measuring 1.3 x 1.2 cm, also seen on prior CT examination. IMPRESSION: 1. Diffuse increase in liver echogenicity, a finding indicative of hepatic steatosis. While no focal liver lesions are evident on this study, it must be cautioned that the sensitivity of ultrasound for detection of focal liver lesions is diminished significantly in this circumstance. 2.  Cyst arising from mid right kidney. 3.  Study otherwise unremarkable. Electronically Signed   By: Lowella Grip III M.D.   On: 11/18/2019 09:29     TODAY-DAY OF DISCHARGE:  Subjective:   Damiean Lukes today has no headache,no chest abdominal pain,no new weakness tingling or numbness, feels much better wants to go home today.   Objective:   Blood pressure 100/60, pulse 94, temperature 98.6 F (37 C), temperature source Oral, resp. rate 20, height '5\' 8"'  (1.727 m), weight 124.7 kg, SpO2 (!) 85 %.  Intake/Output Summary (Last 24 hours) at 12/14/2019 1111 Last data filed at 12/14/2019 0300 Gross per 24 hour  Intake --  Output 1750 ml  Net -1750 ml   Filed Weights   12/06/19 0100  Weight: 124.7 kg    Exam: Awake Alert, Oriented *3, No new F.N deficits, Normal affect [place].AT,PERRAL Supple Neck,No JVD, No cervical lymphadenopathy appriciated.  Symmetrical Chest wall movement, Good air movement bilaterally, CTAB RRR,No Gallops,Rubs or new Murmurs, No Parasternal Heave +ve B.Sounds, Abd Soft, Non tender, No organomegaly appriciated, No rebound  -guarding or rigidity. No Cyanosis, Clubbing or edema, No new Rash or bruise   PERTINENT RADIOLOGIC STUDIES: DG Chest 1 View  Result Date: 12/08/2019 CLINICAL DATA:  Dyspnea EXAM: CHEST  1 VIEW COMPARISON:  12/05/2019 FINDINGS: Bilateral interstitial and patchy alveolar airspace opacities. No pleural effusion or pneumothorax. Stable cardiomediastinal silhouette. No aggressive osseous lesion. IMPRESSION: Bilateral interstitial and patchy alveolar airspace opacities. Findings are concerning for multilobar pneumonia including atypical viral pneumonia. Electronically Signed   By: Kathreen Devoid   On: 12/08/2019 12:07   CT ANGIO CHEST PE W OR WO CONTRAST  Result Date: 12/06/2019 CLINICAL DATA:  Elevated D-dimer. Positive for COVID-19. EXAM: CT ANGIOGRAPHY CHEST WITH CONTRAST TECHNIQUE: Multidetector CT imaging of the chest was performed using the standard protocol during bolus administration of intravenous contrast. Multiplanar CT image reconstructions and MIPs were obtained to evaluate the vascular anatomy. CONTRAST:  123m OMNIPAQUE IOHEXOL 350 MG/ML SOLN COMPARISON:  None. FINDINGS: Cardiovascular: Heart size is normal. Coronary artery calcifications are present. Mild atherosclerotic changes are present at the aortic arch. There is no aneurysm. Great vessel origins are within normal limits. Pulmonary artery opacification is satisfactory. Patient motion degrades evaluation of distal segmental branches. No significant proximal emboli are present. Mediastinum/Nodes: Multiple prominent mediastinal lymph nodes present. High right paratracheal node measures up to 13 mm in short axis. Prevascular nodes measure up to 8 mm. AP window and subcarinal lymph nodes are present as well. There is mild prominence of lymphoid tissue at the hila bilaterally. No significant axillary adenopathy is present. Lungs/Pleura: Diffuse multifocal airspace disease is most prominent at the lung bases there is some atelectasis is well. No  significant effusion is present. There is pneumothorax. Upper Abdomen: Diffuse fatty infiltration of the liver is noted. Upper abdomen is otherwise unremarkable. Musculoskeletal: Mild degenerative changes are  present in the cervical spine. No focal lytic or blastic lesions are present. Review of the MIP images confirms the above findings. IMPRESSION: 1. No evidence for pulmonary embolus. 2. Diffuse multifocal airspace disease compatible with multi lobar pneumonia. 3. Mediastinal adenopathy is likely reactive. 4. Coronary artery disease. 5. Hepatic steatosis. Electronically Signed   By: San Morelle M.D.   On: 12/06/2019 07:35   DG Chest Portable 1 View  Result Date: 12/05/2019 CLINICAL DATA:  Shortness of breath, COVID-19 positive on Tuesday, increasing shortness of breath the past few days. Smoker. EXAM: PORTABLE CHEST 1 VIEW COMPARISON:  Radiograph February 10, 2016 FINDINGS: Some mixed interstitial airspace opacities with basilar and peripheral predominance. Low lung volumes likely some superimposed atelectatic changes as well. Prominence of the cardiomediastinal silhouette is likely accentuated by low volumes and portable technique. Remaining cardiomediastinal contours are unremarkable. No acute osseous or soft tissue abnormality. Degenerative changes are present in the imaged spine and shoulders. Telemetry leads overlie the chest. IMPRESSION: Some mixed interstitial and airspace opacities with basilar and peripheral predominance. Findings are compatible with acute infection in the setting of COVID-19 positivity though edema could have a similar appearance. Electronically Signed   By: Lovena Le M.D.   On: 12/05/2019 18:58   US Abdomen Limited RUQ  Result Date: 11/18/2019 CLINICAL DATA:  Upper abdominal pain EXAM: ULTRASOUND ABDOMEN LIMITED RIGHT UPPER QUADRANT COMPARISON:  CT abdomen and pelvis July 17, 2019. FINDINGS: Gallbladder: No gallstones or wall thickening visualized. There is no  pericholecystic fluid. No sonographic Murphy sign noted by sonographer. Common bile duct: Diameter: 2 mm. No evident intrahepatic or extrahepatic biliary duct dilatation. Liver: No focal lesion identified. Liver echogenicity is increased diffusely. Portal vein is patent on color Doppler imaging with normal direction of blood flow towards the liver. Other: There is an a cyst arising from the lateral mid right kidney measuring 1.3 x 1.2 cm, also seen on prior CT examination. IMPRESSION: 1. Diffuse increase in liver echogenicity, a finding indicative of hepatic steatosis. While no focal liver lesions are evident on this study, it must be cautioned that the sensitivity of ultrasound for detection of focal liver lesions is diminished significantly in this circumstance. 2.  Cyst arising from mid right kidney. 3.  Study otherwise unremarkable. Electronically Signed   By: Lowella Grip III M.D.   On: 11/18/2019 09:29     PERTINENT LAB RESULTS: CBC: Recent Labs    12/13/19 0505 12/14/19 0310  WBC 18.5* 16.0*  HGB 15.9 16.2  HCT 47.9 47.1  PLT 371 309   CMET CMP     Component Value Date/Time   NA 130 (L) 12/14/2019 0310   K 4.4 12/14/2019 0310   CL 92 (L) 12/14/2019 0310   CO2 27 12/14/2019 0310   GLUCOSE 133 (H) 12/14/2019 0310   BUN 22 12/14/2019 0310   CREATININE 0.79 12/14/2019 0310   CALCIUM 8.3 (L) 12/14/2019 0310   PROT 6.1 (L) 12/14/2019 0310   ALBUMIN 3.2 (L) 12/14/2019 0310   AST 30 12/14/2019 0310   ALT 59 (H) 12/14/2019 0310   ALKPHOS 65 12/14/2019 0310   BILITOT 1.3 (H) 12/14/2019 0310   GFRNONAA >60 12/14/2019 0310   GFRAA >60 12/14/2019 0310    GFR Estimated Creatinine Clearance: 105.7 mL/min (by C-G formula based on SCr of 0.79 mg/dL). No results for input(s): LIPASE, AMYLASE in the last 72 hours. No results for input(s): CKTOTAL, CKMB, CKMBINDEX, TROPONINI in the last 72 hours. Invalid input(s): [place]  12/13/19 0505 12/14/19 0310  DDIMER 0.64*  0.66*   No results for input(s): HGBA1C in the last 72 hours. No results for input(s): CHOL, HDL, LDLCALC, TRIG, CHOLHDL, LDLDIRECT in the last 72 hours. No results for input(s): TSH, T4TOTAL, T3FREE, THYROIDAB in the last 72 hours.  Invalid input(s): FREET3 Recent Labs    12/13/19 0505 12/14/19 0310  FERRITIN 799* 827*   Coags: No results for input(s): INR in the last 72 hours.  Invalid input(s): PT Microbiology: Recent Results (from the past 240 hour(s))  Blood Culture (routine x 2)     Status: None   Collection Time: 12/05/19  8:00 PM   Specimen: BLOOD LEFT ARM  Result Value Ref Range Status   Specimen Description BLOOD LEFT ARM  Final   Special Requests   Final    BOTTLES DRAWN AEROBIC ONLY Blood Culture adequate volume   Culture   Final    NO GROWTH 5 DAYS Performed at [facility], [address], [place], [place] [address]    Report Status 12/10/2019 FINAL  Final  Blood Culture (routine x 2)     Status: None   Collection Time: 12/05/19  8:16 PM   Specimen: BLOOD RIGHT ARM  Result Value Ref Range Status   Specimen Description BLOOD RIGHT ARM  Final   Special Requests   Final    BOTTLES DRAWN AEROBIC AND ANAEROBIC Blood Culture adequate volume   Culture   Final    NO GROWTH 5 DAYS Performed at [facility], [place] [address], [place], [place] [address]    Report Status 12/10/2019 FINAL  Final    FURTHER DISCHARGE INSTRUCTIONS:  Get Medicines reviewed and adjusted: Please take all your medications with you for your next visit with your Primary MD  Laboratory/radiological data: Please request your Primary MD to go over all hospital tests and procedure/radiological results at the follow up, please ask your Primary MD to get all Hospital records sent to his/her office.  In some cases, they will be blood work, cultures and biopsy results pending at the time of your discharge. Please request that your primary care M.D. goes through all the records of your  hospital data and follows up on these results.  Also Note the following: If you experience worsening of your admission symptoms, develop shortness of breath, life threatening emergency, suicidal or homicidal thoughts you must seek medical attention immediately by calling 911 or calling your MD immediately  if symptoms less severe.  You must read complete instructions/literature along with all the possible adverse reactions/side effects for all the Medicines you take and that have been prescribed to you. Take any new Medicines after you have completely understood and accpet all the possible adverse reactions/side effects.   Do not drive when taking Pain medications or sleeping medications (Benzodaizepines)  Do not take more than prescribed Pain, Sleep and Anxiety Medications. It is not advisable to combine anxiety,sleep and pain medications without talking with your primary care practitioner  Special Instructions: If you have smoked or chewed Tobacco  in the last 2 yrs please stop smoking, stop any regular Alcohol  and or any Recreational drug use.  Wear Seat belts while driving.  Please note: You were cared for by a hospitalist during your hospital stay. Once you are discharged, your primary care physician will handle any further medical issues. Please note that NO REFILLS for any discharge medications will be authorized once you are discharged, as it is imperative that you  return to your primary care physician (or establish a relationship with a primary care physician if you do not have one) for your post hospital discharge needs so that they can reassess your need for medications and monitor your lab values.  Total Time spent coordinating discharge including counseling, education and face to face time equals 45 minutes.  Signed: Chavela Justiniano 12/14/2019 11:11 AM

## 2019-12-14 NOTE — Discharge Instructions (Signed)
Person Under Monitoring Name: Manuel Castillo.  Location: [address]  [place] [address]   Infection Prevention Recommendations for Individuals Confirmed to have, or Being Evaluated for, 2019 Novel Coronavirus (COVID-19) Infection Who Receive Care at Home  Individuals who are confirmed to have, or are being evaluated for, COVID-19 should follow the prevention steps below until a healthcare provider or local or state health department says they can return to normal activities.  Stay home except to get medical care You should restrict activities outside your home, except for getting medical care. Do not go to work, school, or public areas, and do not use public transportation or taxis.  Call ahead before visiting your doctor Before your medical appointment, call the healthcare provider and tell them that you have, or are being evaluated for, COVID-19 infection. This will help the healthcare provider's office take steps to keep other people from getting infected. Ask your healthcare provider to call the local or state health department.  Monitor your symptoms Seek prompt medical attention if your illness is worsening (e.g., difficulty breathing). Before going to your medical appointment, call the healthcare provider and tell them that you have, or are being evaluated for, COVID-19 infection. Ask your healthcare provider to call the local or state health department.  Wear a facemask You should wear a facemask that covers your nose and mouth when you are in the same room with other people and when you visit a healthcare provider. People who live with or visit you should also wear a facemask while they are in the same room with you.  Separate yourself from other people in your home As much as possible, you should stay in a different room from other people in your home. Also, you should use a separate bathroom, if available.  Avoid sharing household items You should not  share dishes, drinking glasses, cups, eating utensils, towels, bedding, or other items with other people in your home. After using these items, you should wash them thoroughly with soap and water.  Cover your coughs and sneezes Cover your mouth and nose with a tissue when you cough or sneeze, or you can cough or sneeze into your sleeve. Throw used tissues in a lined trash can, and immediately wash your hands with soap and water for at least 20 seconds or use an alcohol-based hand rub.  Wash your [employer] your hands often and thoroughly with soap and water for at least 20 seconds. You can use an alcohol-based hand sanitizer if soap and water are not available and if your hands are not visibly dirty. Avoid touching your eyes, nose, and mouth with unwashed hands.   Prevention Steps for Caregivers and Household Members of Individuals Confirmed to have, or Being Evaluated for, COVID-19 Infection Being Cared for in the Home  If you live with, or provide care at home for, a person confirmed to have, or being evaluated for, COVID-19 infection please follow these guidelines to prevent infection:  Follow healthcare provider's instructions Make sure that you understand and can help the patient follow any healthcare provider instructions for all care.  Provide for the patient's basic needs You should help the patient with basic needs in the home and provide support for getting groceries, prescriptions, and other personal needs.  Monitor the patient's symptoms If they are getting sicker, call his or her medical provider and tell them that the patient has, or is being evaluated for, COVID-19 infection. This will  help the healthcare provider's office take steps to keep other people from getting infected. Ask the healthcare provider to call the local or state health department.  Limit the number of people who have contact with the patient  If possible, have only one caregiver for the  patient.  Other household members should stay in another home or place of residence. If this is not possible, they should stay  in another room, or be separated from the patient as much as possible. Use a separate bathroom, if available.  Restrict visitors who do not have an essential need to be in the home.  Keep older adults, very young children, and other sick people away from the patient Keep older adults, very young children, and those who have compromised immune systems or chronic health conditions away from the patient. This includes people with chronic heart, lung, or kidney conditions, diabetes, and cancer.  Ensure good ventilation Make sure that shared spaces in the home have good air flow, such as from an air conditioner or an opened window, weather permitting.  Wash your hands often  Wash your hands often and thoroughly with soap and water for at least 20 seconds. You can use an alcohol based hand sanitizer if soap and water are not available and if your hands are not visibly dirty.  Avoid touching your eyes, nose, and mouth with unwashed hands.  Use disposable paper towels to dry your hands. If not available, use dedicated cloth towels and replace them when they become wet.  Wear a facemask and gloves  Wear a disposable facemask at all times in the room and gloves when you touch or have contact with the patient's blood, body fluids, and/or secretions or excretions, such as sweat, saliva, sputum, nasal mucus, vomit, urine, or feces.  Ensure the mask fits over your nose and mouth tightly, and do not touch it during use.  Throw out disposable facemasks and gloves after using them. Do not reuse.  Wash your hands immediately after removing your facemask and gloves.  If your personal clothing becomes contaminated, carefully remove clothing and launder. Wash your hands after handling contaminated clothing.  Place all used disposable facemasks, gloves, and other waste in a lined  container before disposing them with other household waste.  Remove gloves and wash your hands immediately after handling these items.  Do not share dishes, glasses, or other household items with the patient  Avoid sharing household items. You should not share dishes, drinking glasses, cups, eating utensils, towels, bedding, or other items with a patient who is confirmed to have, or being evaluated for, COVID-19 infection.  After the person uses these items, you should wash them thoroughly with soap and water.  Wash laundry thoroughly  Immediately remove and wash clothes or bedding that have blood, body fluids, and/or secretions or excretions, such as sweat, saliva, sputum, nasal mucus, vomit, urine, or feces, on them.  Wear gloves when handling laundry from the patient.  Read and follow directions on labels of laundry or clothing items and detergent. In general, wash and dry with the warmest temperatures recommended on the label.  Clean all areas the individual has used often  Clean all touchable surfaces, such as counters, tabletops, doorknobs, bathroom fixtures, toilets, phones, keyboards, tablets, and bedside tables, every day. Also, clean any surfaces that may have blood, body fluids, and/or secretions or excretions on them.  Wear gloves when cleaning surfaces the patient has come in contact with.  Use a diluted bleach solution (e.g.,  dilute bleach with 1 part bleach and 10 parts water) or a household disinfectant with a label that says EPA-registered for coronaviruses. To make a bleach solution at home, add 1 tablespoon of bleach to 1 quart (4 cups) of water. For a larger supply, add  cup of bleach to 1 gallon (16 cups) of water.  Read labels of cleaning products and follow recommendations provided on product labels. Labels contain instructions for safe and effective use of the cleaning product including precautions you should take when applying the product, such as wearing gloves or  eye protection and making sure you have good ventilation during use of the product.  Remove gloves and wash hands immediately after cleaning.  Monitor yourself for signs and symptoms of illness Caregivers and household members are considered close contacts, should monitor their health, and will be asked to limit movement outside of the home to the extent possible. Follow the monitoring steps for close contacts listed on the symptom monitoring form.   ? If you have additional questions, contact your local health department or call the epidemiologist on call at [phone] (available 24/7). ? This guidance is subject to change. For the most up-to-date guidance from [facility], please refer to their website: [email or web address]

## 2019-12-18 DIAGNOSIS — E119 Type 2 diabetes mellitus without complications: Secondary | ICD-10-CM | POA: Diagnosis not present

## 2019-12-18 DIAGNOSIS — J1282 Pneumonia due to coronavirus disease 2019: Secondary | ICD-10-CM | POA: Diagnosis not present

## 2019-12-18 DIAGNOSIS — R590 Localized enlarged lymph nodes: Secondary | ICD-10-CM | POA: Diagnosis not present

## 2019-12-18 DIAGNOSIS — U071 COVID-19: Secondary | ICD-10-CM | POA: Diagnosis not present

## 2019-12-18 DIAGNOSIS — J9601 Acute respiratory failure with hypoxia: Secondary | ICD-10-CM | POA: Diagnosis not present

## 2019-12-18 DIAGNOSIS — Z9981 Dependence on supplemental oxygen: Secondary | ICD-10-CM | POA: Diagnosis not present

## 2019-12-18 DIAGNOSIS — Z09 Encounter for follow-up examination after completed treatment for conditions other than malignant neoplasm: Secondary | ICD-10-CM | POA: Diagnosis not present

## 2019-12-23 ENCOUNTER — Encounter: Payer: Self-pay | Admitting: Internal Medicine

## 2019-12-23 IMAGING — CT CT RENAL STONE PROTOCOL
2 of 4 series · 17 of 46 positions shown, 19 images · non-contrast
Comparison: None.

CLINICAL DATA: 73-year-old male with acute abdominal and flank pain
with hematuria.

EXAM:
CT ABDOMEN AND PELVIS WITHOUT CONTRAST
TECHNIQUE: Multidetector CT imaging of the abdomen and pelvis was performed
following the standard protocol without IV contrast.

[Series 3: renal stone 5.0 · axial · 0.98mm/px · z∈[+695,+1185]mm · 14 of 108 slices shown, 16 images]
[im 5/108  soft-tissue]
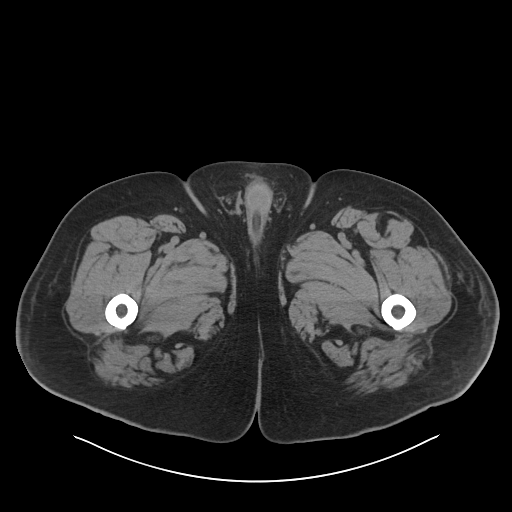
[im 5/108  bone]
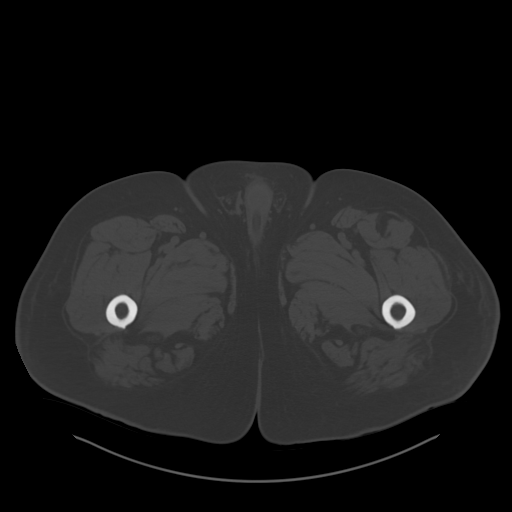
[im 14/108  soft-tissue]
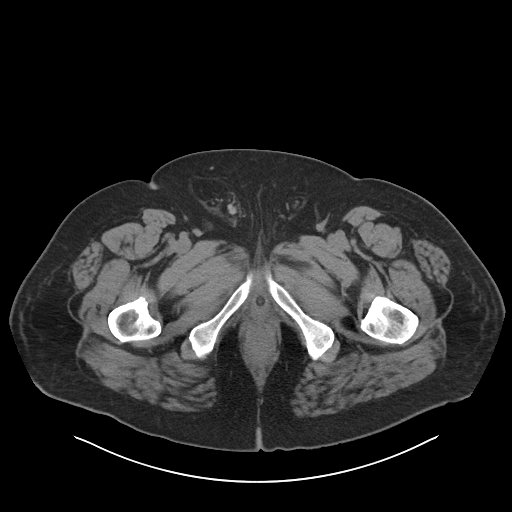
[im 23/108  soft-tissue]
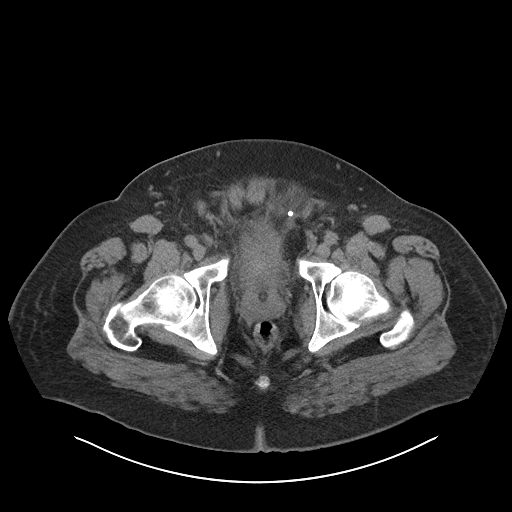
[im 27/108  soft-tissue]
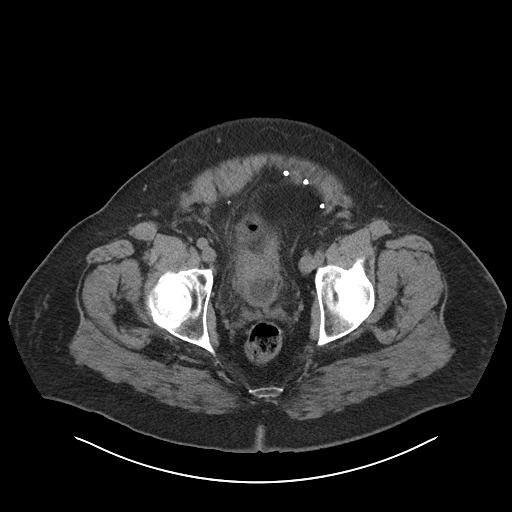
[im 36/108  soft-tissue]
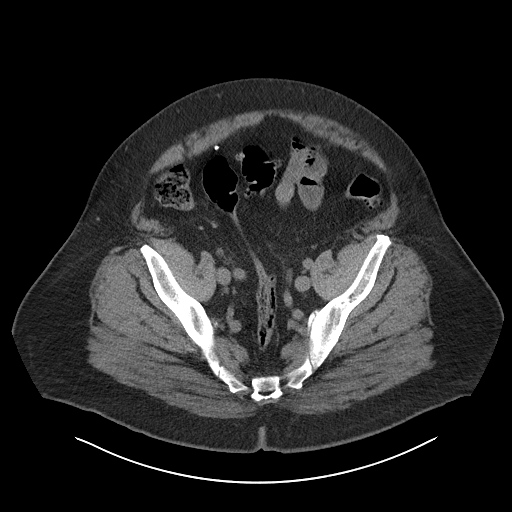
[im 45/108  soft-tissue]
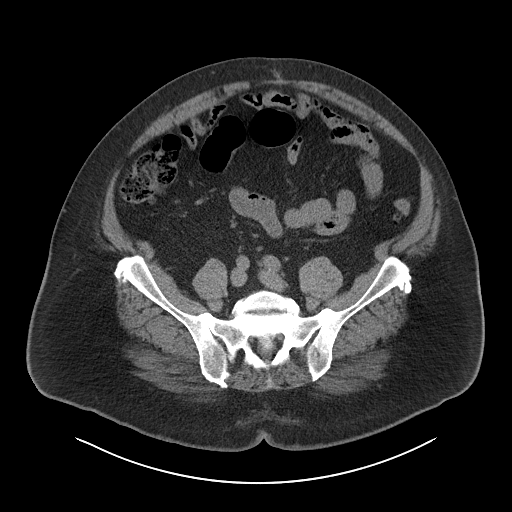
[im 50/108  soft-tissue]
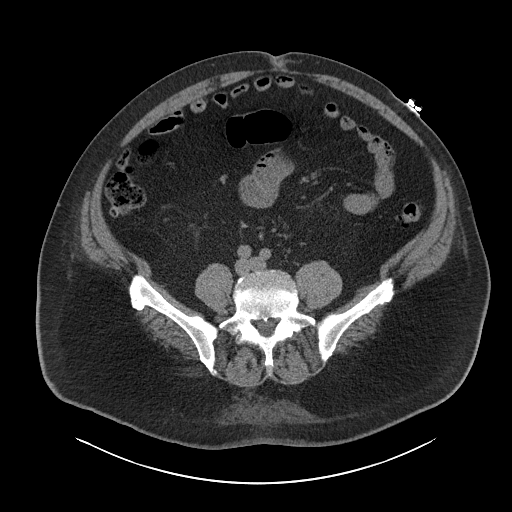
[im 58/108  soft-tissue]
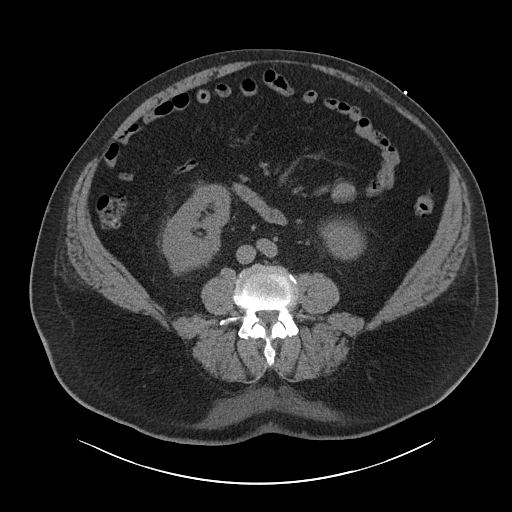
[im 63/108  soft-tissue]
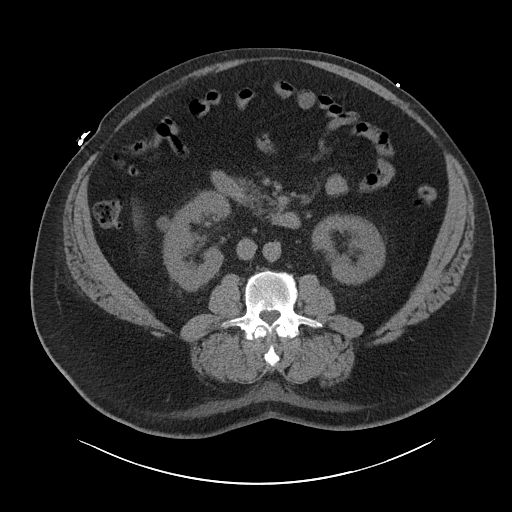
[im 63/108  bone]
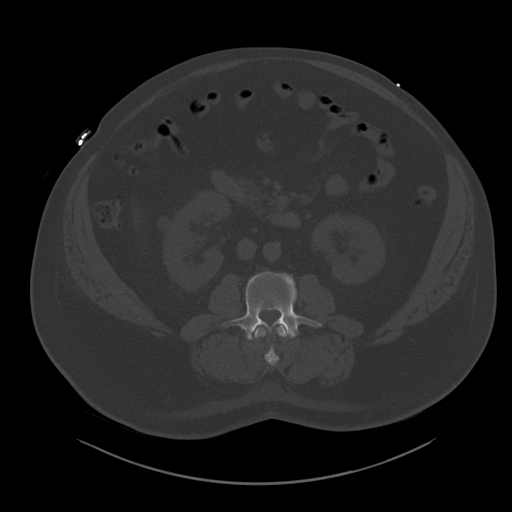
[im 72/108  soft-tissue]
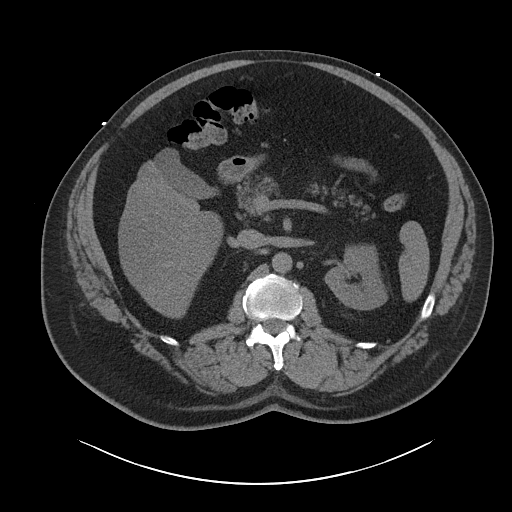
[im 81/108  soft-tissue]
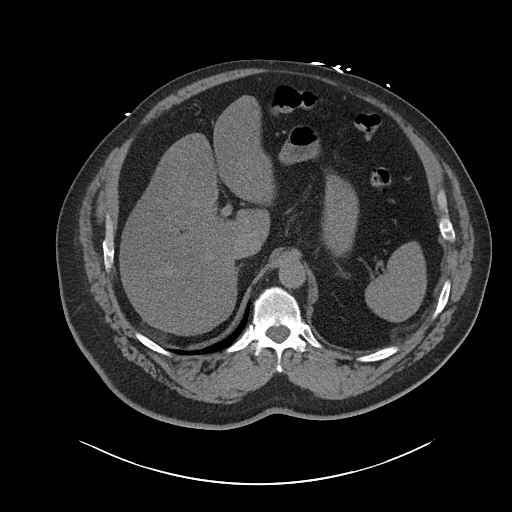
[im 85/108  soft-tissue]
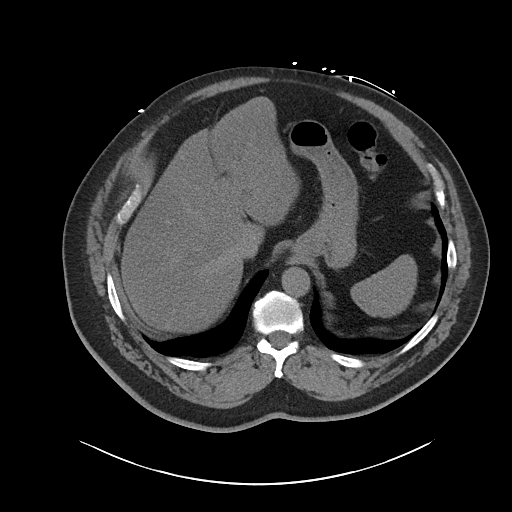
[im 94/108  soft-tissue]
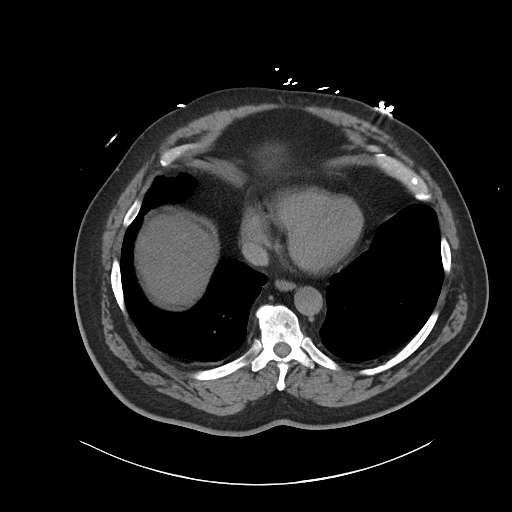
[im 103/108  soft-tissue]
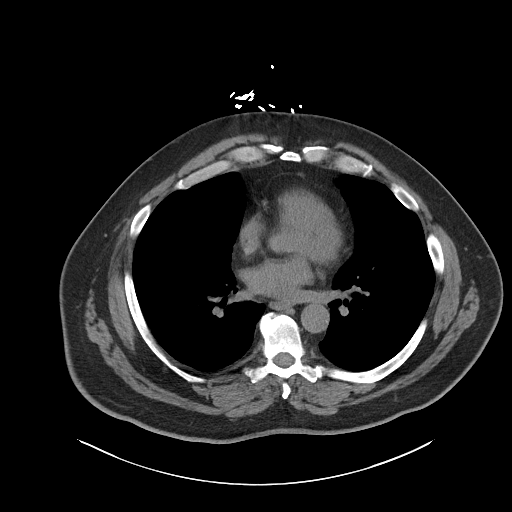

[Series 5: renal stone 3.0 cor · coronal · 1.05mm/px · 3 of 152 slices shown]
[im 51/152  soft-tissue]
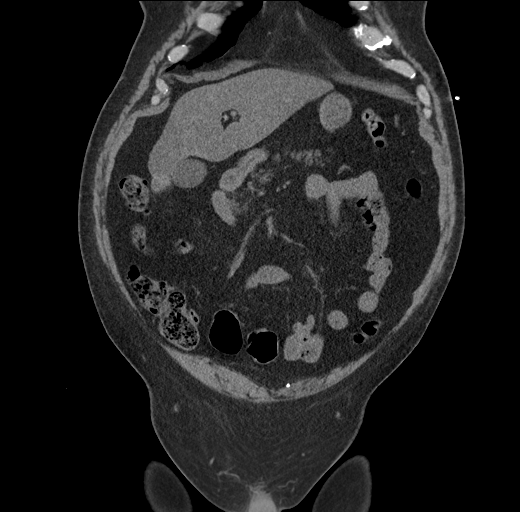
[im 68/152  soft-tissue]
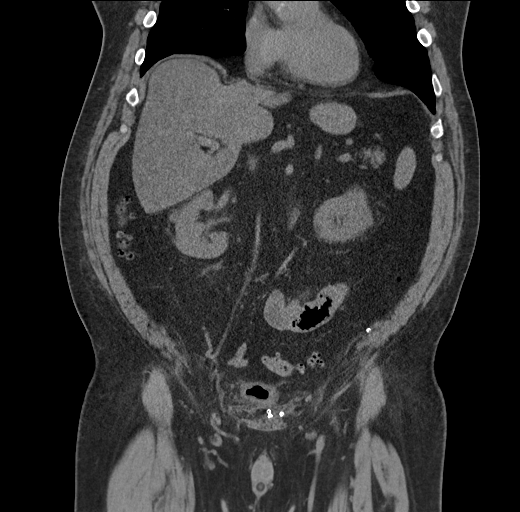
[im 84/152  soft-tissue]
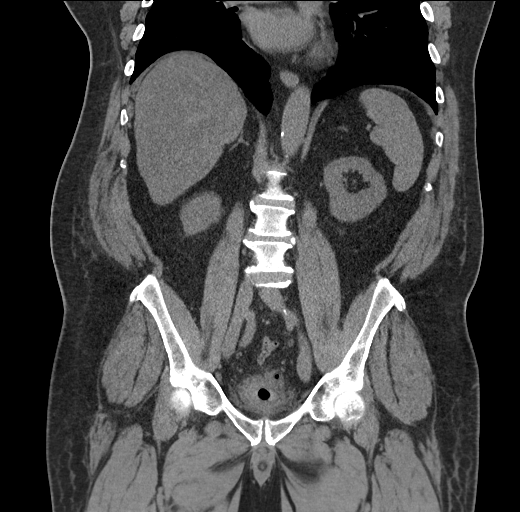

[17 of 46 positions shown; findings below may reference images not displayed]

FINDINGS: Please note that parenchymal abnormalities may be missed without
intravenous contrast.

Lower chest: No acute abnormality.

Hepatobiliary: Hepatic steatosis identified without focal hepatic
abnormality. The gallbladder is unremarkable. No biliary dilatation.

Pancreas: Unremarkable

Spleen: Unremarkable

Adrenals/Urinary Tract: The kidneys are unremarkable except for a
probable 1.5 cm RIGHT renal cyst. There is no evidence of
hydronephrosis or urinary calculi.

A Foley catheter is present within the bladder.

The adrenal glands are unremarkable.

Stomach/Bowel: Stomach is within normal limits. Appendix appears
normal. No evidence of bowel wall thickening, distention, or
inflammatory changes. Colonic diverticulosis noted without evidence
of diverticulitis.

Vascular/Lymphatic: Aortic atherosclerosis. No enlarged abdominal or
pelvic lymph nodes.

Reproductive: Prostate is unremarkable.

Other: No ascites, pneumoperitoneum or focal collection. A small
RIGHT inguinal hernia containing fat is noted.

Musculoskeletal: No acute or suspicious bony abnormalities.
IMPRESSION: 1. No evidence of acute abnormality.
2. Foley catheter within the bladder. No evidence of hydronephrosis
or urinary calculi
3. Hepatic steatosis
4. Small RIGHT inguinal hernia containing fat
5.  Aortic Atherosclerosis ([id number]).

## 2019-12-29 ENCOUNTER — Other Ambulatory Visit: Payer: Self-pay

## 2019-12-31 NOTE — Progress Notes (Signed)
Synopsis: Post COVD ARDS  Assessment & Plan:  Problem 1 recovering COVID ARDS:  Walked around the office today on RA, 90-92% at rest, down to 84% with exertion but quick recovery.  O2 cords affecting his QoL.  He should be fine to use it just PRN during day, would continue to use at night. Problem 2 mediastinal adenopathy- personal review of CT chest on 1/17 shows reactive benign appearing nodes.   This does not need followup imaging. Problem 3 mild persistent asthma controlled well on symbicort and PRN albuterol.  No wheezing in office today.  - Continued activity encouraged - Continue symbicort with PRN albuterol - No followup imaging needed unless persistent DOE - Change O2 to at night and PRN during day rather than ATC - Touch base in 1 month, if continues to improve can d/c O2 altogether  MDM  I reviewed prior external note(s) from Dr. Sloan Leiter on 12/04/2019 which detailed patient's hospitalization for COVID.  I reviewed the result(s) of patients most recent CMP showing mild hyponatremia, CBC showing mild leukocytosis, and CRP which is 0.6, impoved from 33 on admission  I have ordered cessation of supplemental oxygen during day  Review of patient's CTA chest images on 12/06/19 revealed multifocal pneumonitis worse at bases and mild reactive adenopathy, no PE. The patient's images have been independently reviewed by me.   .   End of visit medications:  Current Outpatient Medications:    albuterol (PROVENTIL HFA;VENTOLIN HFA) 108 (90 BASE) MCG/ACT inhaler, Inhale 1-2 puffs into the lungs every 6 (six) hours as needed for wheezing., Disp: 1 Inhaler, Rfl: 0   aspirin EC 81 MG tablet, Take 81 mg by mouth daily., Disp: , Rfl:    blood glucose meter kit and supplies, Dispense based on patient and insurance preference. Use up to four times daily as directed. (FOR ICD-10 E10.9, E11.9)., Disp: 1 each, Rfl: 0   budesonide-formoterol (SYMBICORT) 160-4.5 MCG/ACT inhaler, Inhale 1 puff  into the lungs every morning., Disp: , Rfl:    fluticasone (FLONASE) 50 MCG/ACT nasal spray, Place 1-2 sprays into both nostrils daily as needed (congestion.). , Disp: , Rfl:    lisinopril (ZESTRIL) 20 MG tablet, Take 20 mg by mouth daily., Disp: , Rfl:    Candee Furbish, MD [facility] 01/04/2020 10:50 AM    Subjective:   PATIENT ID: Manuel Castillo. GENDER: male DOB: [birth year], MRN: [id number]  Chief Complaint  Patient presents with   Consult    Patient is here post covid. Patient was diagnosed in Jan. Patient has shortness of breath with exertion. Patient is on oxygen all the time.     HPI Here for evaluation after prolonged hospitalization for COVID pneumonia. Hospitalized from 1/16-1/25/21 for COVID ARDS. Eventually improved and discharged on 1-2LPM O2.  He comes today with slowly improving symptoms. Irritated by oxygen tank, feels concentrator does not do anything. With oxygen tank, recovers from heavy exertion better. Wants to get back to golfing. Occasional dry cough, also improving. ROS as below.  Ancillary information including prior medications, full medical/surgical/family/social histoies, and PFTs (when available) are listed below and have been reviewed.   ROS + symptoms in bold Fevers, chills, weight loss Nausea, vomiting, diarrhea Shortness of breath, wheezing, cough Chest pain, palpitations, lower ext edema   Objective:   Vitals:   01/04/20 0929  BP: 110/60  Pulse: 77  Temp: (!) 97.1 F (36.2 C)  TempSrc: Temporal  SpO2: 93%  Weight: 258 lb 12.8 oz (  117.4 kg)  Height: 5' 8.5" (1.74 m)   See A/P for sats BMI Readings from Last 3 Encounters:  01/04/20 38.78 kg/m  12/06/19 41.81 kg/m   Wt Readings from Last 3 Encounters:  01/04/20 258 lb 12.8 oz (117.4 kg)  12/06/19 275 lb (124.7 kg)    GEN: 74 year old man in no acute distress HEENT: trachea midline, mucus membranes moist CV: Regular rate and rhythm, extremities  are warm PULM: Occasional rhonci on R, otherwise clear with no accessory muscle use GI: Soft, +BS EXT: No edema or clubbing NEURO: Moves all 4 extremities   Ancillary Information    Past Medical History:  Diagnosis Date   COVID-19      Family History  Problem Relation Age of Onset   Hypertension Mother    Lung cancer Father    Hypertension Father      Past Surgical History:  Procedure Laterality Date   HERNIA REPAIR      Social History   Socioeconomic History   Marital status: Married    Spouse name: Not on file   Number of children: Not on file   Years of education: Not on file   Highest education level: Not on file  Occupational History   Not on file  Tobacco Use   Smoking status: Former Smoker    Packs/day: 1.00    Quit date: 03/03/2013    Years since quitting: 6.8   Smokeless tobacco: Never Used   Tobacco comment: quit 8 years ago  Substance and Sexual Activity   Alcohol use: Yes   Drug use: No   Sexual activity: Not on file  Other Topics Concern   Not on file  Social History Narrative   Not on file   Social Determinants of Health   Financial Resource Strain:    Difficulty of Paying Living Expenses: Not on file  Food Insecurity:    Worried About Running Out of Food in the Last Year: Not on file   [employer] of Food in the Last Year: Not on file  Transportation Needs:    Lack of Transportation (Medical): Not on file   Lack of Transportation (Non-Medical): Not on file  Physical Activity:    Days of Exercise per Week: Not on file   Minutes of Exercise per Session: Not on file  Stress:    Feeling of Stress : Not on file  Social Connections:    Frequency of Communication with Friends and Family: Not on file   Frequency of Social Gatherings with Friends and Family: Not on file   Attends Religious Services: Not on file   Active Member of Clubs or Organizations: Not on file   Attends Archivist Meetings: Not on  file   Marital Status: Not on file  Intimate Partner Violence:    Fear of Current or Ex-Partner: Not on file   Emotionally Abused: Not on file   Physically Abused: Not on file   Sexually Abused: Not on file     No Known Allergies   CBC    Component Value Date/Time   WBC 16.0 (H) 12/14/2019 0310   RBC 5.38 12/14/2019 0310   HGB 16.2 12/14/2019 0310   HCT 47.1 12/14/2019 0310   PLT 309 12/14/2019 0310   MCV 87.5 12/14/2019 0310   MCH 30.1 12/14/2019 0310   MCHC 34.4 12/14/2019 0310   RDW 13.7 12/14/2019 0310   LYMPHSABS 0.6 (L) 12/06/2019 0258   MONOABS 0.5 12/06/2019 0258   EOSABS  0.0 12/06/2019 0258   BASOSABS 0.0 12/06/2019 0258    Pulmonary Functions Testing Results: No flowsheet data found.  Outpatient Medications Prior to Visit  Medication Sig Dispense Refill   albuterol (PROVENTIL HFA;VENTOLIN HFA) 108 (90 BASE) MCG/ACT inhaler Inhale 1-2 puffs into the lungs every 6 (six) hours as needed for wheezing. 1 Inhaler 0   aspirin EC 81 MG tablet Take 81 mg by mouth daily.     blood glucose meter kit and supplies Dispense based on patient and insurance preference. Use up to four times daily as directed. (FOR ICD-10 E10.9, E11.9). 1 each 0   budesonide-formoterol (SYMBICORT) 160-4.5 MCG/ACT inhaler Inhale 1 puff into the lungs every morning.     fluticasone (FLONASE) 50 MCG/ACT nasal spray Place 1-2 sprays into both nostrils daily as needed (congestion.).      lisinopril (ZESTRIL) 20 MG tablet Take 20 mg by mouth daily.     dexamethasone (DECADRON) 2 MG tablet Take 1 tablet (2 mg total) by mouth daily. 2 tablet 0   famotidine (PEPCID) 20 MG tablet Take 20 mg by mouth 2 (two) times daily as needed (hearburn/indigestion).      metFORMIN (GLUCOPHAGE) 500 MG tablet Take 1 tablet (500 mg total) by mouth 2 (two) times daily with a meal. 60 tablet 0   No facility-administered medications prior to visit.

## 2020-01-04 ENCOUNTER — Ambulatory Visit (INDEPENDENT_AMBULATORY_CARE_PROVIDER_SITE_OTHER): Payer: PPO | Admitting: Internal Medicine

## 2020-01-04 ENCOUNTER — Encounter: Payer: Self-pay | Admitting: Internal Medicine

## 2020-01-04 ENCOUNTER — Other Ambulatory Visit: Payer: Self-pay

## 2020-01-04 VITALS — BP 110/60 | HR 77 | Temp 97.1°F | Ht 68.5 in | Wt 258.8 lb

## 2020-01-04 DIAGNOSIS — U071 COVID-19: Secondary | ICD-10-CM

## 2020-01-04 DIAGNOSIS — Z23 Encounter for immunization: Secondary | ICD-10-CM

## 2020-01-04 NOTE — Patient Instructions (Signed)
-   Use oxygen at night and if you feel you need it during day - Use symbicort or dulera two times daily - Continue to increase exercise as tolerated - We will touch base in 4 weeks over phone

## 2020-01-14 DIAGNOSIS — U071 COVID-19: Secondary | ICD-10-CM | POA: Diagnosis not present

## 2020-02-08 ENCOUNTER — Ambulatory Visit: Payer: PPO | Admitting: Adult Health

## 2020-02-15 ENCOUNTER — Other Ambulatory Visit: Payer: Self-pay | Admitting: Physician Assistant

## 2020-02-15 DIAGNOSIS — J1282 Pneumonia due to coronavirus disease 2019: Secondary | ICD-10-CM

## 2020-02-15 DIAGNOSIS — U071 COVID-19: Secondary | ICD-10-CM

## 2020-03-01 DIAGNOSIS — J0110 Acute frontal sinusitis, unspecified: Secondary | ICD-10-CM | POA: Diagnosis not present

## 2020-03-02 ENCOUNTER — Ambulatory Visit
Admission: RE | Admit: 2020-03-02 | Discharge: 2020-03-02 | Disposition: A | Discharge status: Home / Self Care | Payer: PPO | Source: Ambulatory Visit | Attending: Physician Assistant | Admitting: Physician Assistant

## 2020-03-02 DIAGNOSIS — U071 COVID-19: Secondary | ICD-10-CM | POA: Diagnosis not present

## 2020-03-02 DIAGNOSIS — J1289 Other viral pneumonia: Secondary | ICD-10-CM | POA: Diagnosis not present

## 2020-03-02 DIAGNOSIS — J1282 Pneumonia due to coronavirus disease 2019: Secondary | ICD-10-CM

## 2020-03-30 DIAGNOSIS — J301 Allergic rhinitis due to pollen: Secondary | ICD-10-CM | POA: Diagnosis not present

## 2020-04-06 DIAGNOSIS — I10 Essential (primary) hypertension: Secondary | ICD-10-CM | POA: Diagnosis not present

## 2020-04-06 DIAGNOSIS — J301 Allergic rhinitis due to pollen: Secondary | ICD-10-CM | POA: Diagnosis not present

## 2020-04-06 DIAGNOSIS — E782 Mixed hyperlipidemia: Secondary | ICD-10-CM | POA: Diagnosis not present

## 2020-04-06 DIAGNOSIS — E119 Type 2 diabetes mellitus without complications: Secondary | ICD-10-CM | POA: Diagnosis not present

## 2020-05-25 DIAGNOSIS — I10 Essential (primary) hypertension: Secondary | ICD-10-CM | POA: Diagnosis not present

## 2020-05-25 DIAGNOSIS — R0981 Nasal congestion: Secondary | ICD-10-CM | POA: Diagnosis not present

## 2020-05-25 DIAGNOSIS — R05 Cough: Secondary | ICD-10-CM | POA: Diagnosis not present

## 2020-05-25 DIAGNOSIS — Z20822 Contact with and (suspected) exposure to covid-19: Secondary | ICD-10-CM | POA: Diagnosis not present

## 2020-05-25 DIAGNOSIS — J0140 Acute pansinusitis, unspecified: Secondary | ICD-10-CM | POA: Diagnosis not present

## 2020-05-25 DIAGNOSIS — R062 Wheezing: Secondary | ICD-10-CM | POA: Diagnosis not present

## 2020-05-25 DIAGNOSIS — J209 Acute bronchitis, unspecified: Secondary | ICD-10-CM | POA: Diagnosis not present

## 2020-07-07 DIAGNOSIS — R05 Cough: Secondary | ICD-10-CM | POA: Diagnosis not present

## 2020-07-07 DIAGNOSIS — Z20822 Contact with and (suspected) exposure to covid-19: Secondary | ICD-10-CM | POA: Diagnosis not present

## 2020-08-09 DIAGNOSIS — J209 Acute bronchitis, unspecified: Secondary | ICD-10-CM | POA: Diagnosis not present

## 2020-08-09 DIAGNOSIS — Z20822 Contact with and (suspected) exposure to covid-19: Secondary | ICD-10-CM | POA: Diagnosis not present

## 2020-08-09 DIAGNOSIS — J019 Acute sinusitis, unspecified: Secondary | ICD-10-CM | POA: Diagnosis not present

## 2020-08-09 DIAGNOSIS — J069 Acute upper respiratory infection, unspecified: Secondary | ICD-10-CM | POA: Diagnosis not present

## 2020-08-09 DIAGNOSIS — J301 Allergic rhinitis due to pollen: Secondary | ICD-10-CM | POA: Diagnosis not present

## 2020-09-19 DIAGNOSIS — J329 Chronic sinusitis, unspecified: Secondary | ICD-10-CM | POA: Diagnosis not present

## 2020-09-19 DIAGNOSIS — J301 Allergic rhinitis due to pollen: Secondary | ICD-10-CM | POA: Diagnosis not present

## 2020-09-19 DIAGNOSIS — I10 Essential (primary) hypertension: Secondary | ICD-10-CM | POA: Diagnosis not present

## 2020-09-19 DIAGNOSIS — Z20822 Contact with and (suspected) exposure to covid-19: Secondary | ICD-10-CM | POA: Diagnosis not present

## 2020-10-03 DIAGNOSIS — J301 Allergic rhinitis due to pollen: Secondary | ICD-10-CM | POA: Diagnosis not present

## 2020-10-03 DIAGNOSIS — I10 Essential (primary) hypertension: Secondary | ICD-10-CM | POA: Diagnosis not present

## 2020-10-03 DIAGNOSIS — E119 Type 2 diabetes mellitus without complications: Secondary | ICD-10-CM | POA: Diagnosis not present

## 2020-10-03 DIAGNOSIS — E782 Mixed hyperlipidemia: Secondary | ICD-10-CM | POA: Diagnosis not present

## 2020-12-12 DIAGNOSIS — I10 Essential (primary) hypertension: Secondary | ICD-10-CM | POA: Diagnosis not present

## 2020-12-12 DIAGNOSIS — Z20822 Contact with and (suspected) exposure to covid-19: Secondary | ICD-10-CM | POA: Diagnosis not present

## 2020-12-12 DIAGNOSIS — J40 Bronchitis, not specified as acute or chronic: Secondary | ICD-10-CM | POA: Diagnosis not present

## 2020-12-12 DIAGNOSIS — U071 COVID-19: Secondary | ICD-10-CM | POA: Diagnosis not present

## 2021-01-04 DIAGNOSIS — J0101 Acute recurrent maxillary sinusitis: Secondary | ICD-10-CM | POA: Diagnosis not present

## 2021-01-04 DIAGNOSIS — Z20822 Contact with and (suspected) exposure to covid-19: Secondary | ICD-10-CM | POA: Diagnosis not present

## 2021-01-23 DIAGNOSIS — J0101 Acute recurrent maxillary sinusitis: Secondary | ICD-10-CM | POA: Diagnosis not present

## 2021-01-23 DIAGNOSIS — J310 Chronic rhinitis: Secondary | ICD-10-CM | POA: Diagnosis not present

## 2021-01-23 DIAGNOSIS — J449 Chronic obstructive pulmonary disease, unspecified: Secondary | ICD-10-CM | POA: Diagnosis not present

## 2021-02-13 DIAGNOSIS — M25562 Pain in left knee: Secondary | ICD-10-CM | POA: Diagnosis not present

## 2021-02-27 DIAGNOSIS — J3489 Other specified disorders of nose and nasal sinuses: Secondary | ICD-10-CM | POA: Diagnosis not present

## 2021-02-27 DIAGNOSIS — J320 Chronic maxillary sinusitis: Secondary | ICD-10-CM | POA: Diagnosis not present

## 2021-02-27 DIAGNOSIS — J0101 Acute recurrent maxillary sinusitis: Secondary | ICD-10-CM | POA: Diagnosis not present

## 2021-02-27 DIAGNOSIS — J322 Chronic ethmoidal sinusitis: Secondary | ICD-10-CM | POA: Diagnosis not present

## 2021-02-27 DIAGNOSIS — J310 Chronic rhinitis: Secondary | ICD-10-CM | POA: Diagnosis not present

## 2021-02-27 DIAGNOSIS — J342 Deviated nasal septum: Secondary | ICD-10-CM | POA: Diagnosis not present

## 2021-03-07 DIAGNOSIS — M10071 Idiopathic gout, right ankle and foot: Secondary | ICD-10-CM | POA: Diagnosis not present

## 2021-04-04 DIAGNOSIS — J342 Deviated nasal septum: Secondary | ICD-10-CM | POA: Diagnosis not present

## 2021-04-04 DIAGNOSIS — J330 Polyp of nasal cavity: Secondary | ICD-10-CM | POA: Diagnosis not present

## 2021-04-04 DIAGNOSIS — J343 Hypertrophy of nasal turbinates: Secondary | ICD-10-CM | POA: Diagnosis not present

## 2021-05-25 DIAGNOSIS — R0600 Dyspnea, unspecified: Secondary | ICD-10-CM | POA: Diagnosis not present

## 2021-05-25 DIAGNOSIS — R062 Wheezing: Secondary | ICD-10-CM | POA: Diagnosis not present

## 2021-06-08 DIAGNOSIS — R0600 Dyspnea, unspecified: Secondary | ICD-10-CM | POA: Diagnosis not present

## 2021-06-08 DIAGNOSIS — R062 Wheezing: Secondary | ICD-10-CM | POA: Diagnosis not present

## 2021-06-22 DIAGNOSIS — J069 Acute upper respiratory infection, unspecified: Secondary | ICD-10-CM | POA: Diagnosis not present

## 2021-06-22 DIAGNOSIS — J441 Chronic obstructive pulmonary disease with (acute) exacerbation: Secondary | ICD-10-CM | POA: Diagnosis not present

## 2021-06-22 DIAGNOSIS — Z20822 Contact with and (suspected) exposure to covid-19: Secondary | ICD-10-CM | POA: Diagnosis not present

## 2021-07-06 DIAGNOSIS — J342 Deviated nasal septum: Secondary | ICD-10-CM | POA: Diagnosis not present

## 2021-07-06 DIAGNOSIS — J330 Polyp of nasal cavity: Secondary | ICD-10-CM | POA: Diagnosis not present

## 2021-07-06 DIAGNOSIS — J343 Hypertrophy of nasal turbinates: Secondary | ICD-10-CM | POA: Diagnosis not present

## 2021-07-25 DIAGNOSIS — J441 Chronic obstructive pulmonary disease with (acute) exacerbation: Secondary | ICD-10-CM | POA: Diagnosis not present

## 2021-07-25 DIAGNOSIS — Z20822 Contact with and (suspected) exposure to covid-19: Secondary | ICD-10-CM | POA: Diagnosis not present

## 2021-08-24 DIAGNOSIS — Z8616 Personal history of COVID-19: Secondary | ICD-10-CM | POA: Diagnosis not present

## 2021-08-24 DIAGNOSIS — J441 Chronic obstructive pulmonary disease with (acute) exacerbation: Secondary | ICD-10-CM | POA: Diagnosis not present

## 2021-08-29 DIAGNOSIS — J309 Allergic rhinitis, unspecified: Secondary | ICD-10-CM | POA: Diagnosis not present

## 2021-08-29 DIAGNOSIS — J449 Chronic obstructive pulmonary disease, unspecified: Secondary | ICD-10-CM | POA: Diagnosis not present

## 2021-08-29 DIAGNOSIS — J339 Nasal polyp, unspecified: Secondary | ICD-10-CM | POA: Diagnosis not present

## 2021-08-29 DIAGNOSIS — Z299 Encounter for prophylactic measures, unspecified: Secondary | ICD-10-CM | POA: Diagnosis not present

## 2021-09-11 DIAGNOSIS — J301 Allergic rhinitis due to pollen: Secondary | ICD-10-CM | POA: Diagnosis not present

## 2021-09-11 DIAGNOSIS — Z0000 Encounter for general adult medical examination without abnormal findings: Secondary | ICD-10-CM | POA: Diagnosis not present

## 2021-09-11 DIAGNOSIS — Z6841 Body Mass Index (BMI) 40.0 and over, adult: Secondary | ICD-10-CM | POA: Diagnosis not present

## 2021-09-11 DIAGNOSIS — I10 Essential (primary) hypertension: Secondary | ICD-10-CM | POA: Diagnosis not present

## 2021-09-11 DIAGNOSIS — Z1159 Encounter for screening for other viral diseases: Secondary | ICD-10-CM | POA: Diagnosis not present

## 2021-09-11 DIAGNOSIS — J449 Chronic obstructive pulmonary disease, unspecified: Secondary | ICD-10-CM | POA: Diagnosis not present

## 2021-09-11 DIAGNOSIS — E119 Type 2 diabetes mellitus without complications: Secondary | ICD-10-CM | POA: Diagnosis not present

## 2021-09-11 DIAGNOSIS — M25562 Pain in left knee: Secondary | ICD-10-CM | POA: Diagnosis not present

## 2021-09-11 DIAGNOSIS — E782 Mixed hyperlipidemia: Secondary | ICD-10-CM | POA: Diagnosis not present

## 2021-09-11 DIAGNOSIS — G8929 Other chronic pain: Secondary | ICD-10-CM | POA: Diagnosis not present

## 2021-09-13 ENCOUNTER — Ambulatory Visit: Payer: PPO | Admitting: Pulmonary Disease

## 2021-09-13 ENCOUNTER — Encounter: Payer: Self-pay | Admitting: Pulmonary Disease

## 2021-09-13 ENCOUNTER — Other Ambulatory Visit: Payer: Self-pay

## 2021-09-13 VITALS — BP 138/80 | HR 64 | Temp 98.2°F | Ht 68.5 in | Wt 272.0 lb

## 2021-09-13 DIAGNOSIS — R911 Solitary pulmonary nodule: Secondary | ICD-10-CM | POA: Diagnosis not present

## 2021-09-13 DIAGNOSIS — J4540 Moderate persistent asthma, uncomplicated: Secondary | ICD-10-CM | POA: Diagnosis not present

## 2021-09-13 DIAGNOSIS — J339 Nasal polyp, unspecified: Secondary | ICD-10-CM

## 2021-09-13 NOTE — Patient Instructions (Addendum)
Nice to meet you  We will get blood work today  We will start the process of a medication called Nucala  I will prescribe an epipen as here is a very small risk of bad allergic reaction or anaphylaxis  Continue all other medications  Return to clinic in 3 months or sooner as needed with Dr. Judeth Horn

## 2021-09-14 LAB — ANCA SCREEN W REFLEX TITER: ANCA Screen: NEGATIVE

## 2021-09-14 NOTE — Progress Notes (Signed)
'@Patient'  ID: Manuel Castillo., male    DOB: 1946/03/08, 75 y.o.   MRN: [id number]  Chief Complaint  Patient presents with   Consult    COPD    Referring provider: Pieter Partridge, PA  HPI:   75 y.o. man whom we are seeing in consultation for evaluation of recurrent dyspnea, cough, response of the prednisone presumed COPD with conflicting information regarding recent PFT results in care everywhere.  Note from referring provider x2 reviewed.  Patient with longstanding history of shortness of breath, over the last couple years with pandemic.  Seems worse over the last year or so.  On average it seems like he is getting 1-2 prednisone courses a month over the last 6 months.  Symptoms include productive cough, increased dyspnea, wheeze.  Prednisone antibiotics resolved this quite well.  However within a week or 2 after finishing prednisone course symptoms recur requiring repeat courses of treatment.  He does endorse seasonal allergies but is not sure I does not think that changes in seasons has made things better or worse.  No time of day when symptoms are better or worse.  Previous on Symbicort.  Placed on montelukast and Trelegy within the last couple weeks.  He thinks Trelegy has helped and improved his symptoms compared to what he felt like on Symbicort.  Notably completed his most recent course of prednisone about 7 to 10 days ago.  Overall feeling better than he normally does over this timeframe around stopping prednisone courses.  He reports recurrent nasal congestion.  Much improved with montelukast and fluticasone nasal spray.  Reviewed CT chest 02/2020 that on my review interpretation reveals mild scattered groundglass bilaterally improved compared to 11/2019, thickened bronchioles, mild mosaicism.  PMH: Tobacco abuse in remission, chronic bronchitis/asthma, hypertension, seasonal allergies Surgical history: Hernia repair Family history: Mother with hypertension, father with hypertension,  lung cancer   Questionaires / Pulmonary Flowsheets:   ACT:  No flowsheet data found.  MMRC: No flowsheet data found.  Epworth:  No flowsheet data found.  Tests:   FENO:  No results found for: NITRICOXIDE  PFT: No flowsheet data found.  WALK:  No flowsheet data found.  Imaging: Personally reviewed and as per EMR discussion in this note  Lab Results: Personally reviewed, notably eosinophils as high as 1520 21, more recently [phone] despite multiple courses of prednisone CBC    Component Value Date/Time   WBC 16.0 (H) 12/14/2019 0310   RBC 5.38 12/14/2019 0310   HGB 16.2 12/14/2019 0310   HCT 47.1 12/14/2019 0310   PLT 309 12/14/2019 0310   MCV 87.5 12/14/2019 0310   MCH 30.1 12/14/2019 0310   MCHC 34.4 12/14/2019 0310   RDW 13.7 12/14/2019 0310   LYMPHSABS 0.6 (L) 12/06/2019 0258   MONOABS 0.5 12/06/2019 0258   EOSABS 0.0 12/06/2019 0258   BASOSABS 0.0 12/06/2019 0258    BMET    Component Value Date/Time   NA 130 (L) 12/14/2019 0310   K 4.4 12/14/2019 0310   CL 92 (L) 12/14/2019 0310   CO2 27 12/14/2019 0310   GLUCOSE 133 (H) 12/14/2019 0310   BUN 22 12/14/2019 0310   CREATININE 0.79 12/14/2019 0310   CALCIUM 8.3 (L) 12/14/2019 0310   GFRNONAA >60 12/14/2019 0310   GFRAA >60 12/14/2019 0310    BNP    Component Value Date/Time   BNP 76.2 12/05/2019 2015    ProBNP No results found for: PROBNP  Specialty Problems  Pulmonary Problems   Acute respiratory failure with hypoxia (HCC)   Pneumonia due to COVID-19 virus    No Known Allergies  Immunization History  Administered Date(s) Administered   Fluad Quad(high Dose 65+) 08/20/2019   Influenza, High Dose Seasonal PF 10/04/2018, 09/03/2019   Influenza-Unspecified 08/20/2019   Moderna Sars-Covid-2 Vaccination 12/09/2019, 03/09/2020    Past Medical History:  Diagnosis Date   COVID-19     Tobacco History: Social History   Tobacco Use  Smoking Status Former   Packs/day:  1.00   Types: Cigarettes   Quit date: 03/03/2013   Years since quitting: 8.5  Smokeless Tobacco Never  Tobacco Comments   quit 8 years ago   Counseling given: Not Answered Tobacco comments: quit 8 years ago   Continue to not smoke  Outpatient Encounter Medications as of 09/13/2021  Medication Sig   albuterol (PROVENTIL HFA;VENTOLIN HFA) 108 (90 BASE) MCG/ACT inhaler Inhale 1-2 puffs into the lungs every 6 (six) hours as needed for wheezing.   blood glucose meter kit and supplies Dispense based on patient and insurance preference. Use up to four times daily as directed. (FOR ICD-10 E10.9, E11.9).   ipratropium (ATROVENT) 0.06 % nasal spray SPRAY 2 SPRAYS INTO EACH NOSTRIL EVERY 6 HOURS AS NEEDED   montelukast (SINGULAIR) 10 MG tablet Take 10 mg by mouth daily.   TRELEGY ELLIPTA 100-62.5-25 MCG/ACT AEPB Take 1 puff by mouth daily.   aspirin EC 81 MG tablet Take 81 mg by mouth daily. (Patient not taking: Reported on 09/13/2021)   budesonide-formoterol (SYMBICORT) 160-4.5 MCG/ACT inhaler Inhale 1 puff into the lungs every morning. (Patient not taking: Reported on 09/13/2021)   fluticasone (FLONASE) 50 MCG/ACT nasal spray Place 1-2 sprays into both nostrils daily as needed (congestion.).  (Patient not taking: Reported on 09/13/2021)   lisinopril (ZESTRIL) 20 MG tablet Take 20 mg by mouth daily. (Patient not taking: Reported on 09/13/2021)   No facility-administered encounter medications on file as of 09/13/2021.     Review of Systems  Review of Systems  No chest pain with exertion, no orthopnea or PND, comprehensive review of systems otherwise negative Physical Exam  BP 138/80 (BP Location: Left Arm, Cuff Size: Large)   Pulse 64   Temp 98.2 F (36.8 C) (Oral)   Ht 5' 8.5" (1.74 m)   Wt 272 lb (123.4 kg)   SpO2 95%   BMI 40.76 kg/m   Wt Readings from Last 5 Encounters:  09/13/21 272 lb (123.4 kg)  01/04/20 258 lb 12.8 oz (117.4 kg)  12/06/19 275 lb (124.7 kg)    BMI  Readings from Last 5 Encounters:  09/13/21 40.76 kg/m  01/04/20 38.78 kg/m  12/06/19 41.81 kg/m     Physical Exam General: Well-appearing, no distress Eyes: EOMI, icterus Neck: Supple, no JVP Pulmonary: Clear, no wheeze, normal work of breathing Cardiovascular: Regular rate and rhythm, no murmur Abdomen: Nondistended, bowel sounds present MSK: No synovitis, no joint effusion Neuro: Normal gait, no weakness Psych: Normal mood, full affect   Assessment & Plan:   Dyspnea, cough: Persistent.  Improved with Trelegy over the last couple weeks.  Symbicort previously without good control.  Significant smoking history, 50+ pack year.  High suspicion for COPD.  Reportedly PFTs normal at [facility] although this conflicting information in the chart via care everywhere.  Response to prednisone.  Given recurrent exacerbations, about once a month, history of significantly elevated eosinophils, high suspicion for asthma as well.  Asthma: Atopic symptoms, eosinophilia, recurrent steroid responsive  respiratory disease.  Continue Trelegy.  Plan start Nucala given frequent exacerbations, about once a month over the last several months.  Chest x-ray reassuring.  PFTs reportedly normal in the past.  Consider repeating in the future, will try to obtain PFTs done 7/22 via [facility] system.  Lung nodule: 6 mm RUL 02/2020 - see no f/u imaging. CT chest ordered for follow up.  Return in about 3 months (around 12/14/2021).   Lanier Clam, MD 09/14/2021

## 2021-09-25 ENCOUNTER — Other Ambulatory Visit: Payer: Self-pay

## 2021-09-25 ENCOUNTER — Ambulatory Visit (INDEPENDENT_AMBULATORY_CARE_PROVIDER_SITE_OTHER)
Admission: RE | Admit: 2021-09-25 | Discharge: 2021-09-25 | Disposition: A | Discharge status: Home / Self Care | Payer: PPO | Source: Ambulatory Visit | Attending: Pulmonary Disease | Admitting: Pulmonary Disease

## 2021-09-25 DIAGNOSIS — R911 Solitary pulmonary nodule: Secondary | ICD-10-CM | POA: Diagnosis not present

## 2021-09-25 DIAGNOSIS — I700 Atherosclerosis of aorta: Secondary | ICD-10-CM | POA: Diagnosis not present

## 2021-09-25 DIAGNOSIS — J439 Emphysema, unspecified: Secondary | ICD-10-CM | POA: Diagnosis not present

## 2021-09-26 NOTE — Progress Notes (Signed)
CT scan results are encouraging. Prior 6 mm lung nodule is smaller at 3 mm. There is a lymph node on the right that the radiologist recommends repeat CT in 3 months to follow up. If patient is ok with that, let me know and I will order a repeat CT for 12/2021.

## 2021-09-28 ENCOUNTER — Other Ambulatory Visit: Payer: Self-pay

## 2021-09-28 DIAGNOSIS — R590 Localized enlarged lymph nodes: Secondary | ICD-10-CM

## 2021-09-28 NOTE — Progress Notes (Signed)
Yes, needs to see me in ~3 months. Can be after repeat CT is done. Thanks!

## 2021-09-29 ENCOUNTER — Telehealth: Payer: Self-pay

## 2021-09-29 ENCOUNTER — Other Ambulatory Visit (HOSPITAL_COMMUNITY): Payer: Self-pay

## 2021-09-29 NOTE — Telephone Encounter (Signed)
Received New start paperwork for NUCALA. Will update as we work through the benefits process.   Submitted a Prior Authorization request to [facility] for NUCALA via CoverMyMeds. Will update once we receive a response.  Key: [id number]   Patient did not indicate household income on application, more importantly application was not signed by pt at most recent appointment. Will need to mail application to pt for signature.

## 2021-09-29 NOTE — Telephone Encounter (Signed)
Received notification from [facility] regarding a prior authorization for NUCALA. Authorization has been APPROVED from 09/29/2021 to 09/29/2022.   Per test claim, copay for 28 days supply is $810.15  Patient can fill through [facility]: [phone]   Authorization # [id number]   Patient portion has been mailed for signature, provider portion on hand along with approval letter from insurance. Will continue to f/u.

## 2021-10-11 ENCOUNTER — Other Ambulatory Visit (HOSPITAL_COMMUNITY): Payer: Self-pay

## 2021-10-11 NOTE — Telephone Encounter (Signed)
Submitted Patient Assistance Application to [employer] [facility] for NUCALA along with provider portion, PA and income documents. Will update patient when we receive a response.  Fax# [phone] Phone# [phone] 

## 2021-10-25 NOTE — Telephone Encounter (Signed)
Received a fax from  [facility] [place] regarding an approval for NUCALA patient assistance from 10/24/2021 to 11/18/2021. Approval letter and enrollment form sent to scan center, will retain documents to resubmit in 2023.  Contacted patient and provided phone number to [facility], advised that pharmacy will be reaching out to schedule shipment. Also informed pt that [facility] would be reaching out to go over clinical information and talk about getting New Start visit scheduled. Pt verbalized understanding.

## 2021-10-25 NOTE — Telephone Encounter (Signed)
Spoke with patient regarding Nucala approval and new start visit. He states he'd like to see Dr. Judeth Horn first before starting the medication.  I advised that labs that were drawn in October 2022 suggest he would be a good candidate for Nucala for his asthma and nasal polyps. He states he'd still like to wait until appt in January 2023.  I explained that we'd have to reenroll into Nucala PAP since his current enrollment is good through 11/18/21 only. He verbalized understanding.  Chesley Mires, PharmD, MPH, BCPS Clinical Pharmacist (Rheumatology and Pulmonology)

## 2021-10-25 NOTE — Telephone Encounter (Signed)
Routing for clinical f/u. °

## 2021-11-27 ENCOUNTER — Telehealth: Payer: Self-pay | Admitting: Pulmonary Disease

## 2021-11-28 NOTE — Telephone Encounter (Signed)
Patient was previously approved through [facility] [place] until 11/18/2021, however never started treatment.   Will draft new enrollment paperwork for signature at his appointment tomorrow 11/29/21.

## 2021-11-29 ENCOUNTER — Ambulatory Visit (INDEPENDENT_AMBULATORY_CARE_PROVIDER_SITE_OTHER): Payer: PPO | Admitting: Pulmonary Disease

## 2021-11-29 ENCOUNTER — Other Ambulatory Visit: Payer: Self-pay

## 2021-11-29 ENCOUNTER — Encounter: Payer: Self-pay | Admitting: Pulmonary Disease

## 2021-11-29 DIAGNOSIS — J4530 Mild persistent asthma, uncomplicated: Secondary | ICD-10-CM

## 2021-11-29 DIAGNOSIS — R0609 Other forms of dyspnea: Secondary | ICD-10-CM | POA: Diagnosis not present

## 2021-11-29 DIAGNOSIS — J45909 Unspecified asthma, uncomplicated: Secondary | ICD-10-CM | POA: Insufficient documentation

## 2021-11-29 NOTE — Telephone Encounter (Signed)
Received notification from Dr. Judeth Horn that patient will not be moving forward with Nucala since he has significantly improved with Trelegy. Closing this encounter.  Chesley Mires, PharmD, MPH, BCPS Clinical Pharmacist (Rheumatology and Pulmonology)

## 2021-11-29 NOTE — Progress Notes (Signed)
'@Patient'  ID: Manuel Coffin., male    DOB: 12-08-1945, 76 y.o.   MRN: [id number]  Chief Complaint  Patient presents with   Follow-up    3 month follow up. Wants to discuss some medication with MH. But patient says he is feeling good with no issues with breathing noted     Referring provider: Josefa Half*  HPI:   76 y.o. man whom we are seeing in follow up for evaluation of recurrent dyspnea, cough, response of the prednisone presumed COPD with conflicting information regarding recent PFT results in care everywhere, reportedly normal which is not consistent for COPD.  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  Overall is doing quite well.  He continues Trelegy.  No exacerbations in the last 3 months or so.  He attributes this to Trelegy.  He had just started that right before he came to see me at last visit.  He is increased his exercise tolerance.  Walking on the treadmill 10 minutes at a time.  Thinks is helped the shortness of breath as well.  Discussed at length the rationale for using Nucala.  Given well-controlled symptoms over last 5 months okay to defer starting Nucala for now.  Discussed if he has worsening symptoms or exacerbations in the future we should jump on Nucala to prevent ongoing steroid use like he had in the fall 2022.  HPI  Patient with longstanding history of shortness of breath, over the last couple years with pandemic.  Seems worse over the last year or so.  On average it seems like he is getting 1-2 prednisone courses a month over the last 6 months.  Symptoms include productive cough, increased dyspnea, wheeze.  Prednisone antibiotics resolved this quite well.  However within a week or 2 after finishing prednisone course symptoms recur requiring repeat courses of treatment.  He does endorse seasonal allergies but is not sure I does not think that changes in seasons has made  things better or worse.  No time of day when symptoms are better or worse.  Previous on Symbicort.  Placed on montelukast and Trelegy within the last couple weeks.  He thinks Trelegy has helped and improved his symptoms compared to what he felt like on Symbicort.  Notably completed his most recent course of prednisone about 7 to 10 days ago.  Overall feeling better than he normally does over this timeframe around stopping prednisone courses.  He reports recurrent nasal congestion.  Much improved with montelukast and fluticasone nasal spray.  Reviewed CT chest 02/2020 that on my review interpretation reveals mild scattered groundglass bilaterally improved compared to 11/2019, thickened bronchioles, mild mosaicism.  PMH: Tobacco abuse in remission, chronic bronchitis/asthma, hypertension, seasonal allergies Surgical history: Hernia repair Family history: Mother with hypertension, father with hypertension, lung cancer   Questionaires / Pulmonary Flowsheets:   ACT:  No flowsheet data found.  MMRC: No flowsheet data found.  Epworth:  No flowsheet data found.  Tests:   FENO:  No results found for: NITRICOXIDE  PFT: No flowsheet data found.  WALK:  No flowsheet data found.  Imaging: Personally reviewed and as per EMR discussion in this note  Lab Results: Personally reviewed, notably eosinophils as high as 1520 21, more recently [phone] despite multiple courses of prednisone CBC    Component Value Date/Time   WBC 16.0 (H) 12/14/2019 0310   RBC 5.38 12/14/2019 0310   HGB 16.2 12/14/2019 0310  HCT 47.1 12/14/2019 0310   PLT 309 12/14/2019 0310   MCV 87.5 12/14/2019 0310   MCH 30.1 12/14/2019 0310   MCHC 34.4 12/14/2019 0310   RDW 13.7 12/14/2019 0310   LYMPHSABS 0.6 (L) 12/06/2019 0258   MONOABS 0.5 12/06/2019 0258   EOSABS 0.0 12/06/2019 0258   BASOSABS 0.0 12/06/2019 0258    BMET    Component Value Date/Time   NA 130 (L) 12/14/2019 0310   K 4.4 12/14/2019 0310    CL 92 (L) 12/14/2019 0310   CO2 27 12/14/2019 0310   GLUCOSE 133 (H) 12/14/2019 0310   BUN 22 12/14/2019 0310   CREATININE 0.79 12/14/2019 0310   CALCIUM 8.3 (L) 12/14/2019 0310   GFRNONAA >60 12/14/2019 0310   GFRAA >60 12/14/2019 0310    BNP    Component Value Date/Time   BNP 76.2 12/05/2019 2015    ProBNP No results found for: PROBNP  Specialty Problems       Pulmonary Problems   Acute respiratory failure with hypoxia (HCC)   Pneumonia due to COVID-19 virus    No Known Allergies  Immunization History  Administered Date(s) Administered   Fluad Quad(high Dose 65+) 08/20/2019   Influenza, High Dose Seasonal PF 10/04/2018, 09/03/2019   Influenza-Unspecified 08/20/2019, 09/30/2021   Moderna Sars-Covid-2 Vaccination 12/09/2019, 03/09/2020    Past Medical History:  Diagnosis Date   COVID-19     Tobacco History: Social History   Tobacco Use  Smoking Status Former   Packs/day: 1.00   Types: Cigarettes   Quit date: 03/03/2013   Years since quitting: 8.7  Smokeless Tobacco Never  Tobacco Comments   quit 8 years ago   Counseling given: Not Answered Tobacco comments: quit 8 years ago   Continue to not smoke  Outpatient Encounter Medications as of 76/09/2022  Medication Sig   albuterol (PROVENTIL HFA;VENTOLIN HFA) 108 (90 BASE) MCG/ACT inhaler Inhale 1-2 puffs into the lungs every 6 (six) hours as needed for wheezing.   aspirin EC 81 MG tablet Take 81 mg by mouth daily.   blood glucose meter kit and supplies Dispense based on patient and insurance preference. Use up to four times daily as directed. (FOR ICD-10 E10.9, E11.9).   budesonide-formoterol (SYMBICORT) 160-4.5 MCG/ACT inhaler Inhale 1 puff into the lungs every morning.   fluticasone (FLONASE) 50 MCG/ACT nasal spray Place 1-2 sprays into both nostrils daily as needed (congestion.).   ipratropium (ATROVENT) 0.06 % nasal spray SPRAY 2 SPRAYS INTO EACH NOSTRIL EVERY 6 HOURS AS NEEDED   lisinopril (ZESTRIL)  20 MG tablet Take 20 mg by mouth daily.   montelukast (SINGULAIR) 10 MG tablet Take 10 mg by mouth daily.   TRELEGY ELLIPTA 100-62.5-25 MCG/ACT AEPB Take 1 puff by mouth daily.   No facility-administered encounter medications on file as of 11/29/2021.     Review of Systems  Review of Systems  No chest pain with exertion, no orthopnea or PND, comprehensive review of systems otherwise negative Physical Exam  BP 136/74 (BP Location: Left Arm, Patient Position: Sitting, Cuff Size: Normal)    Pulse 63    Temp 98.5 F (36.9 C) (Oral)    Ht '5\' 8"'  (1.727 m)    Wt 277 lb 9.6 oz (125.9 kg)    SpO2 96%    BMI 42.21 kg/m   Wt Readings from Last 5 Encounters:  11/29/21 277 lb 9.6 oz (125.9 kg)  09/13/21 272 lb (123.4 kg)  01/04/20 258 lb 12.8 oz (117.4 kg)  12/06/19 275  lb (124.7 kg)    BMI Readings from Last 5 Encounters:  11/29/21 42.21 kg/m  09/13/21 40.76 kg/m  01/04/20 38.78 kg/m  12/06/19 41.81 kg/m     Physical Exam General: Well-appearing, no distress Eyes: EOMI, icterus Neck: Supple, no JVP Pulmonary: Clear, no wheeze, normal work of breathing Cardiovascular: Regular rate and rhythm, no murmur Abdomen: Nondistended, bowel sounds present MSK: No synovitis, no joint effusion Neuro: Normal gait, no weakness Psych: Normal mood, full affect   Assessment & Plan:   Dyspnea, cough: Persistent.  Improved with Trelegy over the last couple weeks.  Symbicort previously without good control.  Significant smoking history, 50+ pack year.  High suspicion for COPD.  Reportedly PFTs normal at [facility] although this conflicting information in the chart via care everywhere.  Response to prednisone.  Given recurrent exacerbations, about once a month, history of significantly elevated eosinophils, high suspicion for asthma as well.  Asthma: Atopic symptoms, eosinophilia, recurrent steroid responsive respiratory disease.  Continue Trelegy.  We will hold off alternative Nucala for now  given well-controlled symptoms on Trelegy.  Would move forward if he requires any additional prednisone or if symptoms of dyspnea and cough worsen despite ongoing Trelegy use.  Lung nodule: 6 mm RUL 02/2020 - see no f/u imaging. CT chest ordered for follow up.  Return in about 3 months (around 02/27/2022).   Lanier Clam, MD 11/29/2021

## 2021-11-29 NOTE — Patient Instructions (Signed)
Nice to see you again  So symptoms are well controlled on Trelegy, I think we should certainly continue this.  Rinse your mouth after every use.  Please continue the nasal spray.  Given symptoms have improved, I recommend we do not start the Nucala at this time.  However, if your breathing worsens at any time in the coming months or you need prednisone again I think we should reconsider and would recommend starting Nucala at that time.  Return to clinic in 3 months or sooner as needed with Dr. Judeth Horn

## 2021-11-29 NOTE — Progress Notes (Signed)
Noted. Closing Nucala BIV and PAP renewal encounter.  Chesley Mires, PharmD, MPH, BCPS Clinical Pharmacist (Rheumatology and Pulmonology)

## 2021-12-28 ENCOUNTER — Ambulatory Visit (INDEPENDENT_AMBULATORY_CARE_PROVIDER_SITE_OTHER)
Admission: RE | Admit: 2021-12-28 | Discharge: 2021-12-28 | Disposition: A | Discharge status: Home / Self Care | Payer: PPO | Source: Ambulatory Visit | Attending: Pulmonary Disease | Admitting: Pulmonary Disease

## 2021-12-28 ENCOUNTER — Other Ambulatory Visit: Payer: Self-pay

## 2021-12-28 DIAGNOSIS — R590 Localized enlarged lymph nodes: Secondary | ICD-10-CM

## 2021-12-29 NOTE — Progress Notes (Signed)
CT chest shows stable small nodules - good news! Please let patient know.

## 2022-02-20 ENCOUNTER — Ambulatory Visit: Payer: PPO | Admitting: Pulmonary Disease

## 2022-03-14 ENCOUNTER — Ambulatory Visit (INDEPENDENT_AMBULATORY_CARE_PROVIDER_SITE_OTHER): Payer: PPO | Admitting: Pulmonary Disease

## 2022-03-14 ENCOUNTER — Encounter: Payer: Self-pay | Admitting: Pulmonary Disease

## 2022-03-14 VITALS — BP 130/68 | HR 60 | Temp 98.6°F | Ht 68.0 in | Wt 277.0 lb

## 2022-03-14 DIAGNOSIS — J4530 Mild persistent asthma, uncomplicated: Secondary | ICD-10-CM | POA: Diagnosis not present

## 2022-03-14 DIAGNOSIS — R0609 Other forms of dyspnea: Secondary | ICD-10-CM

## 2022-03-14 MED ORDER — TRELEGY ELLIPTA 200-62.5-25 MCG/ACT IN AEPB: 1.0000 | INHALATION_SPRAY | Freq: Every day | RESPIRATORY_TRACT | 11 refills | Status: DC

## 2022-03-14 NOTE — Patient Instructions (Signed)
Nice to see you ? ?I increased the trelegy dose to the highest dose. ? ?We will mail Nucala paperwork. ? ?Return to clinic in 3 months ?

## 2022-03-14 NOTE — Progress Notes (Signed)
? ?'@Patient'  ID: Manuel Castillo., male    DOB: Feb 10, 1946, 76 y.o.   MRN: [id number] ? ?Chief Complaint  ?Patient presents with  ? Follow-up  ?  Follow up. Pt states he is having some issues with the dyspnea. He states he thinks its the weather but is unsure.   ? ? ?Referring provider: ?Josefa Half* ? ?HPI:  ? ?76 y.o. man whom we are seeing in follow up for evaluation of recurrent dyspnea, cough, response of the prednisone presumed COPD with conflicting information regarding recent PFT results in care everywhere, reportedly normal which is not consistent for COPD.  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 ? ?Return to clinic for routine follow-up.  Unfortunately, dyspnea bit worse.  Over last couple months.  He associates with pollen season.  Continues on low-dose Trelegy.  This is been remarkable beneficial in the past.  More dyspnea when playing golf. Tiring out the back 9 etc.  Similar symptoms to how he felt prior to starting Trelegy. ? ?HPI initial visit ?Patient with longstanding history of shortness of breath, over the last couple years with pandemic.  Seems worse over the last year or so.  On average it seems like he is getting 1-2 prednisone courses a month over the last 6 months.  Symptoms include productive cough, increased dyspnea, wheeze.  Prednisone antibiotics resolved this quite well.  However within a week or 2 after finishing prednisone course symptoms recur requiring repeat courses of treatment.  He does endorse seasonal allergies but is not sure I does not think that changes in seasons has made things better or worse.  No time of day when symptoms are better or worse.  Previous on Symbicort.  Placed on montelukast and Trelegy within the last couple weeks.  He thinks Trelegy has helped and improved his symptoms compared to what he felt like on Symbicort.  Notably completed his most recent course of  prednisone about 7 to 10 days ago.  Overall feeling better than he normally does over this timeframe around stopping prednisone courses.  He reports recurrent nasal congestion.  Much improved with montelukast and fluticasone nasal spray. ? ?Reviewed CT chest 02/2020 that on my review interpretation reveals mild scattered groundglass bilaterally improved compared to 11/2019, thickened bronchioles, mild mosaicism. ? ?PMH: Tobacco abuse in remission, chronic bronchitis/asthma, hypertension, seasonal allergies ?Surgical history: Hernia repair ?Family history: Mother with hypertension, father with hypertension, lung cancer ? ? ?Questionaires / Pulmonary Flowsheets:  ? ?ACT:  ?   ? View : No data to display.  ?  ?  ?  ? ? ?MMRC: ?   ? View : No data to display.  ?  ?  ?  ? ? ?Epworth:  ?   ? View : No data to display.  ?  ?  ?  ? ? ?Tests:  ? ?FENO:  ?No results found for: NITRICOXIDE ? ?PFT: ?   ? View : No data to display.  ?  ?  ?  ? ? ?WALK:  ?   ? View : No data to display.  ?  ?  ?  ? ? ?Imaging: ?Personally reviewed and as per EMR discussion in this note ? ?Lab Results: ?Personally reviewed, notably eosinophils as high as 1520 21, more recently [phone] despite multiple courses of prednisone ?CBC ?   ?Component Value Date/Time  ? WBC 16.0 (H) 12/14/2019 0310  ? RBC 5.38 12/14/2019 0310  ?  HGB 16.2 12/14/2019 0310  ? HCT 47.1 12/14/2019 0310  ? PLT 309 12/14/2019 0310  ? MCV 87.5 12/14/2019 0310  ? MCH 30.1 12/14/2019 0310  ? MCHC 34.4 12/14/2019 0310  ? RDW 13.7 12/14/2019 0310  ? LYMPHSABS 0.6 (L) 12/06/2019 0258  ? MONOABS 0.5 12/06/2019 0258  ? EOSABS 0.0 12/06/2019 0258  ? BASOSABS 0.0 12/06/2019 0258  ? ? ?BMET ?   ?Component Value Date/Time  ? NA 130 (L) 12/14/2019 0310  ? K 4.4 12/14/2019 0310  ? CL 92 (L) 12/14/2019 0310  ? CO2 27 12/14/2019 0310  ? GLUCOSE 133 (H) 12/14/2019 0310  ? BUN 22 12/14/2019 0310  ? CREATININE 0.79 12/14/2019 0310  ? CALCIUM 8.3 (L) 12/14/2019 0310  ? GFRNONAA >60 12/14/2019  0310  ? GFRAA >60 12/14/2019 0310  ? ? ?BNP ?   ?Component Value Date/Time  ? BNP 76.2 12/05/2019 2015  ? ? ?ProBNP ?No results found for: PROBNP ? ?Specialty Problems   ? ?  ? Pulmonary Problems  ? Asthma  ? DOE (dyspnea on exertion)  ? ? ?No Known Allergies ? ?Immunization History  ?Administered Date(s) Administered  ? Fluad Quad(high Dose 65+) 08/20/2019  ? Influenza, High Dose Seasonal PF 10/04/2018, 09/03/2019  ? Influenza-Unspecified 08/20/2019, 09/30/2021  ? Moderna Sars-Covid-2 Vaccination 12/09/2019, 03/09/2020  ? ? ?Past Medical History:  ?Diagnosis Date  ? COVID-19   ? ? ?Tobacco History: ?Social History  ? ?Tobacco Use  ?Smoking Status Former  ? Packs/day: 1.00  ? Types: Cigarettes  ? Quit date: 03/03/2013  ? Years since quitting: 9.0  ?Smokeless Tobacco Never  ?Tobacco Comments  ? quit 8 years ago  ? ?Counseling given: Not Answered ?Tobacco comments: quit 8 years ago ? ? ?Continue to not smoke ? ?Outpatient Encounter Medications as of 03/14/2022  ?Medication Sig  ? albuterol (PROVENTIL HFA;VENTOLIN HFA) 108 (90 BASE) MCG/ACT inhaler Inhale 1-2 puffs into the lungs every 6 (six) hours as needed for wheezing.  ? aspirin EC 81 MG tablet Take 81 mg by mouth daily.  ? blood glucose meter kit and supplies Dispense based on patient and insurance preference. Use up to four times daily as directed. (FOR ICD-10 E10.9, E11.9).  ? fluticasone (FLONASE) 50 MCG/ACT nasal spray Place 1-2 sprays into both nostrils daily as needed (congestion.).  ? Fluticasone-Umeclidin-Vilant (TRELEGY ELLIPTA) 200-62.5-25 MCG/ACT AEPB Inhale 1 puff into the lungs daily.  ? ipratropium (ATROVENT) 0.06 % nasal spray SPRAY 2 SPRAYS INTO EACH NOSTRIL EVERY 6 HOURS AS NEEDED  ? lisinopril (ZESTRIL) 20 MG tablet Take 20 mg by mouth daily.  ? montelukast (SINGULAIR) 10 MG tablet Take 10 mg by mouth daily.  ? [DISCONTINUED] budesonide-formoterol (SYMBICORT) 160-4.5 MCG/ACT inhaler Inhale 1 puff into the lungs every morning.  ? [DISCONTINUED]  TRELEGY ELLIPTA 100-62.5-25 MCG/ACT AEPB Take 1 puff by mouth daily.  ? ?No facility-administered encounter medications on file as of 03/14/2022.  ? ? ? ?Review of Systems ? ?Review of Systems  ?N/a ?Physical Exam ? ?BP 130/68 (BP Location: Left Arm, Patient Position: Sitting, Cuff Size: Normal)   Pulse 60   Temp 98.6 ?F (37 ?C) (Oral)   Ht '5\' 8"'  (1.727 m)   Wt 277 lb (125.6 kg)   SpO2 97%   BMI 42.12 kg/m?  ? ?Wt Readings from Last 5 Encounters:  ?03/14/22 277 lb (125.6 kg)  ?11/29/21 277 lb 9.6 oz (125.9 kg)  ?09/13/21 272 lb (123.4 kg)  ?01/04/20 258 lb 12.8 oz (117.4 kg)  ?12/06/19  275 lb (124.7 kg)  ? ? ?BMI Readings from Last 5 Encounters:  ?03/14/22 42.12 kg/m?  ?11/29/21 42.21 kg/m?  ?09/13/21 40.76 kg/m?  ?01/04/20 38.78 kg/m?  ?12/06/19 41.81 kg/m?  ? ? ? ?Physical Exam ?General: Well-appearing, no distress ?Eyes: EOMI, icterus ?Neck: Supple, no JVP ?Pulmonary: Clear, no wheeze, normal work of breathing ?Cardiovascular: Regular rate and rhythm, no murmur ?Abdomen: Nondistended, bowel sounds present ?MSK: No synovitis, no joint effusion ?Neuro: Normal gait, no weakness ?Psych: Normal mood, full affect ? ? ?Assessment & Plan:  ? ?Dyspnea, cough: Had improved tremendously with Trelegy low-dose.  Now worse over the last 4 months.  Possibly uncontrolled asthma symptoms.  Query cardiac etiology.  He denies any issues with heart.  Symptoms feel similar to prior issues that were treated with Trelegy.  See below.  If not improving with escalating inhaler therapy would recommend cardiology evaluation. ? ?Asthma: Atopic symptoms, eosinophilia, recurrent steroid responsive respiratory disease.  Symptoms initially improved but now a bit worse with worsening pollens.  Escalate low-dose to high-dose Trelegy.  Consider Nucala once again in the future, will mail him paperwork and assess need after trial of high-dose Trelegy. ? ?Lung nodule: 6 mm RUL 02/2020, repeat CT 12/2021 all stable, no further follow-up  needed. ? ?Return in about 3 months (around 06/13/2022). ? ? ?Lanier Clam, MD ?03/14/2022 ? ? ? ?

## 2022-06-13 ENCOUNTER — Encounter: Payer: Self-pay | Admitting: Pulmonary Disease

## 2022-06-13 ENCOUNTER — Ambulatory Visit (INDEPENDENT_AMBULATORY_CARE_PROVIDER_SITE_OTHER): Payer: PPO | Admitting: Pulmonary Disease

## 2022-06-13 VITALS — BP 124/68 | HR 72 | Temp 98.5°F | Ht 68.0 in | Wt 273.8 lb

## 2022-06-13 DIAGNOSIS — J4530 Mild persistent asthma, uncomplicated: Secondary | ICD-10-CM

## 2022-06-13 DIAGNOSIS — R0609 Other forms of dyspnea: Secondary | ICD-10-CM

## 2022-06-13 MED ORDER — FLUTICASONE FUROATE-VILANTEROL 200-25 MCG/ACT IN AEPB: 1.0000 | INHALATION_SPRAY | Freq: Every day | RESPIRATORY_TRACT | 11 refills | Status: DC

## 2022-06-13 MED ORDER — TRELEGY ELLIPTA 200-62.5-25 MCG/ACT IN AEPB: 1.0000 | INHALATION_SPRAY | Freq: Every day | RESPIRATORY_TRACT | 0 refills | Status: DC

## 2022-06-13 MED ORDER — SPIRIVA RESPIMAT 2.5 MCG/ACT IN AERS: 2.0000 | INHALATION_SPRAY | Freq: Every day | RESPIRATORY_TRACT | 11 refills | Status: DC

## 2022-06-13 NOTE — Progress Notes (Signed)
'@Patient'  ID: [name]., male    DOB: Sep 14, 1946, 76 y.o.   MRN: [id number]  Chief Complaint  Patient presents with   Follow-up    Follow up for [place]. Pt states that he is doing well since last visit. Pt is on Trelegy daily and Albuterol as needed. Pt states trelegy is working well for him with no issues. Pt wants to talk to [facility] about a different medication.     Referring provider: Josefa Half*  HPI:   76 y.o. man whom we are seeing in follow up for evaluation of recurrent dyspnea, cough, response of the prednisone presumed COPD with conflicting information regarding recent PFT results in care everywhere, reportedly normal which is not consistent for COPD.  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  Return to clinic for routine follow-up.  Was having some worsening symptoms through the winter and spring.  Trelegy dose was escalated from mid dose to high-dose.  Concern for uncontrolled asthma symptoms thus justifying increase ICS dose.  This is greatly helped with symptoms.  Breathing at baseline.  He did fall behind on nasal regimen which led to sinus and ear infections.  Back on this and improved.  No issues for now.  Unfortunately, Trelegy is quite expensive.  Spoke with the [place] found some alternatives that may be cheaper with his [facility] benefits.  We are making substitutions to his medication regimen as below.  HPI initial visit Patient with longstanding history of shortness of breath, over the last couple years with pandemic.  Seems worse over the last year or so.  On average it seems like he is getting 1-2 prednisone courses a month over the last 6 months.  Symptoms include productive cough, increased dyspnea, wheeze.  Prednisone antibiotics resolved this quite well.  However within a week or 2 after finishing prednisone course symptoms recur requiring repeat courses of treatment.  He does endorse  seasonal allergies but is not sure I does not think that changes in seasons has made things better or worse.  No time of day when symptoms are better or worse.  Previous on Symbicort.  Placed on montelukast and Trelegy within the last couple weeks.  He thinks Trelegy has helped and improved his symptoms compared to what he felt like on Symbicort.  Notably completed his most recent course of prednisone about 7 to 10 days ago.  Overall feeling better than he normally does over this timeframe around stopping prednisone courses.  He reports recurrent nasal congestion.  Much improved with montelukast and fluticasone nasal spray.  Reviewed CT chest 02/2020 that on my review interpretation reveals mild scattered groundglass bilaterally improved compared to 11/2019, thickened bronchioles, mild mosaicism.  PMH: Tobacco abuse in remission, chronic bronchitis/asthma, hypertension, seasonal allergies Surgical history: Hernia repair Family history: Mother with hypertension, father with hypertension, lung cancer   Questionaires / Pulmonary Flowsheets:   ACT:      No data to display           MMRC:     No data to display           Epworth:      No data to display           Tests:   FENO:  No results found for: "NITRICOXIDE"  PFT:     No data to display           WALK:      No  data to display           Imaging: Personally reviewed and as per EMR discussion in this note  Lab Results: Personally reviewed, notably eosinophils as high as 1520 21, more recently [phone] despite multiple courses of prednisone CBC    Component Value Date/Time   WBC 16.0 (H) 12/14/2019 0310   RBC 5.38 12/14/2019 0310   HGB 16.2 12/14/2019 0310   HCT 47.1 12/14/2019 0310   PLT 309 12/14/2019 0310   MCV 87.5 12/14/2019 0310   MCH 30.1 12/14/2019 0310   MCHC 34.4 12/14/2019 0310   RDW 13.7 12/14/2019 0310   LYMPHSABS 0.6 (L) 12/06/2019 0258   MONOABS 0.5 12/06/2019 0258   EOSABS  0.0 12/06/2019 0258   BASOSABS 0.0 12/06/2019 0258    BMET    Component Value Date/Time   NA 130 (L) 12/14/2019 0310   K 4.4 12/14/2019 0310   CL 92 (L) 12/14/2019 0310   CO2 27 12/14/2019 0310   GLUCOSE 133 (H) 12/14/2019 0310   BUN 22 12/14/2019 0310   CREATININE 0.79 12/14/2019 0310   CALCIUM 8.3 (L) 12/14/2019 0310   GFRNONAA >60 12/14/2019 0310   GFRAA >60 12/14/2019 0310    BNP    Component Value Date/Time   BNP 76.2 12/05/2019 2015    ProBNP No results found for: "PROBNP"  Specialty Problems       Pulmonary Problems   Asthma   DOE (dyspnea on exertion)    No Known Allergies  Immunization History  Administered Date(s) Administered   Fluad Quad(high Dose 65+) 08/20/2019   Influenza, High Dose Seasonal PF 10/04/2018, 09/03/2019   Influenza-Unspecified 08/20/2019, 09/30/2021   Moderna Sars-Covid-2 Vaccination 12/09/2019, 03/09/2020    Past Medical History:  Diagnosis Date   COVID-19     Tobacco History: Social History   Tobacco Use  Smoking Status Former   Packs/day: 1.00   Types: Cigarettes   Quit date: 03/03/2013   Years since quitting: 9.2  Smokeless Tobacco Never  Tobacco Comments   quit 8 years ago   Counseling given: Not Answered Tobacco comments: quit 8 years ago   Continue to not smoke  Outpatient Encounter Medications as of 06/13/2022  Medication Sig   albuterol (PROVENTIL HFA;VENTOLIN HFA) 108 (90 BASE) MCG/ACT inhaler Inhale 1-2 puffs into the lungs every 6 (six) hours as needed for wheezing.   aspirin EC 81 MG tablet Take 81 mg by mouth daily.   blood glucose meter kit and supplies Dispense based on patient and insurance preference. Use up to four times daily as directed. (FOR ICD-10 E10.9, E11.9).   fluticasone (FLONASE) 50 MCG/ACT nasal spray Place 1-2 sprays into both nostrils daily as needed (congestion.).   fluticasone furoate-vilanterol (BREO ELLIPTA) 200-25 MCG/ACT AEPB Inhale 1 puff into the lungs daily.    Fluticasone-Umeclidin-Vilant (TRELEGY ELLIPTA) 200-62.5-25 MCG/ACT AEPB Inhale 1 puff into the lungs daily.   ipratropium (ATROVENT) 0.06 % nasal spray SPRAY 2 SPRAYS INTO EACH NOSTRIL EVERY 6 HOURS AS NEEDED   lisinopril (ZESTRIL) 20 MG tablet Take 20 mg by mouth daily.   montelukast (SINGULAIR) 10 MG tablet Take 10 mg by mouth daily.   Tiotropium Bromide Monohydrate (SPIRIVA RESPIMAT) 2.5 MCG/ACT AERS Inhale 2 puffs into the lungs daily.   No facility-administered encounter medications on file as of 06/13/2022.     Review of Systems  Review of Systems  N/a Physical Exam  BP 124/68 (BP Location: Left Arm, Patient Position: Sitting, Cuff Size: Normal)   Pulse  72   Temp 98.5 F (36.9 C) (Oral)   Ht '5\' 8"'  (1.727 m)   Wt 273 lb 12.8 oz (124.2 kg)   SpO2 98%   BMI 41.63 kg/m   Wt Readings from Last 5 Encounters:  06/13/22 273 lb 12.8 oz (124.2 kg)  03/14/22 277 lb (125.6 kg)  11/29/21 277 lb 9.6 oz (125.9 kg)  09/13/21 272 lb (123.4 kg)  01/04/20 258 lb 12.8 oz (117.4 kg)    BMI Readings from Last 5 Encounters:  06/13/22 41.63 kg/m  03/14/22 42.12 kg/m  11/29/21 42.21 kg/m  09/13/21 40.76 kg/m  01/04/20 38.78 kg/m     Physical Exam General: Well-appearing, no distress Eyes: EOMI, icterus Neck: Supple, no JVP Pulmonary: Clear, no wheeze, normal work of breathing Cardiovascular: Regular rate and rhythm, no murmur Abdomen: Nondistended, bowel sounds present MSK: No synovitis, no joint effusion Neuro: Normal gait, no weakness Psych: Normal mood, full affect   Assessment & Plan:   Dyspnea, cough: Probably related to asthma with eosinophilic component.  Improved initially with low-dose Trelegy.  Worsened and further improved with high-dose Trelegy.  We will continue high-dose ICS/LABA/LAMA therapy as below.  Asthma: Atopic symptoms, eosinophilia, recurrent steroid responsive respiratory disease.  Symptoms initially improved but now a bit worse with worsening  pollens.  Escalate low-dose to high-dose Trelegy 02/2022 with improvement.  Given cost, will go through [place].  Generic high-dose Breo 1 puff daily and high-dose Breo Respimat 2 puff twice daily sent to [facility] today.  Consider substitution of Wixela high-dose for Breo if not covered although I have had generic Breo covered by the [place] recently.  Nucala in the future.  Lung nodule: 6 mm RUL 02/2020, repeat CT 12/2021 all stable, no further follow-up needed.  Return in about 6 months (around 12/14/2022).   Lanier Clam, MD 06/13/2022

## 2022-06-13 NOTE — Patient Instructions (Signed)
Nice to see you again  I am glad Trelegy has helped  I have made changes to your medication that should be more affordable through the [place]  Use Breo 1 puff once a day.  Rinse your mouth out after every use.  This replicates 2 of the medicines and Trelegy.  Use Spiriva 2 puffs once a day.  This replicates of the third medicine and Trelegy.  These changes are to get everything once a day and to be more affordable.  If there are issues with cost through the pharmacy, please let me know and we will substitute Wixela for the [facility].  The downside of this is that it is twice a day as opposed to once a day.  Return to clinic in 6 months or sooner as needed with Dr. Judeth Horn

## 2022-06-13 NOTE — Addendum Note (Signed)
Addended by: Lavetta Nielsen L on: 06/13/2022 10:03 AM   Modules accepted: Orders

## 2022-06-22 ENCOUNTER — Telehealth: Payer: Self-pay | Admitting: Pulmonary Disease

## 2022-06-22 MED ORDER — SPIRIVA RESPIMAT 2.5 MCG/ACT IN AERS: 2.0000 | INHALATION_SPRAY | Freq: Every day | RESPIRATORY_TRACT | 11 refills | Status: DC

## 2022-06-22 MED ORDER — FLUTICASONE FUROATE-VILANTEROL 200-25 MCG/ACT IN AEPB: 1.0000 | INHALATION_SPRAY | Freq: Every day | RESPIRATORY_TRACT | 11 refills | Status: DC

## 2022-06-22 NOTE — Telephone Encounter (Signed)
Called and spoke to patient and he states that he needs Virgel Bouquet and [place] faxed to the [place]. Patient states that it did not go through when we tried to send it e-scribe. Nothing further needed

## 2022-06-26 ENCOUNTER — Telehealth: Payer: Self-pay | Admitting: Pulmonary Disease

## 2022-06-28 NOTE — Telephone Encounter (Signed)
Called and spoke with pt who states he was told that the [place] is unable to supply breo or spiriva. Stated to pt that we would check to see what inhalers the VA is able to get for him and once we had this info we would call him back and he verbalized understanding.   Routing to prior auth team to see if they are able to let us know which inhalers the VA is able to provide pt based off of insurance.

## 2022-07-03 NOTE — Telephone Encounter (Signed)
Delsa Bern, CPhT  You 28 minutes ago (10:22 AM)    Virgel Bouquet is not listed, however Spiriva is on the [place] formulary. I am unsure why the VA would not be able to supply this to the patient.   VA formulary (current update June 2023) can be accessed at [email or web address] if needed.     Routing this message to Dr. Judeth Horn for him to review. Please advise.

## 2022-07-11 MED ORDER — SPIRIVA RESPIMAT 2.5 MCG/ACT IN AERS: 2.0000 | INHALATION_SPRAY | Freq: Every day | RESPIRATORY_TRACT | 11 refills | Status: DC

## 2022-07-11 MED ORDER — FLUTICASONE-SALMETEROL 500-50 MCG/ACT IN AEPB: 1.0000 | INHALATION_SPRAY | Freq: Two times a day (BID) | RESPIRATORY_TRACT | 4 refills | Status: DC

## 2022-07-11 NOTE — Telephone Encounter (Signed)
Looked at list for the [place]. Sent in [place] and Adviar diskus. Nothing further needed

## 2022-07-11 NOTE — Telephone Encounter (Signed)
Can we check to see what is going on with inhalers. Has he gotten Spriiva? And I have patients that have gotten generic Breo via the [place]. If needed I can send the medications elsewhere.

## 2023-02-19 ENCOUNTER — Other Ambulatory Visit: Payer: Self-pay

## 2023-02-19 ENCOUNTER — Encounter (HOSPITAL_COMMUNITY): Payer: Self-pay | Admitting: *Deleted

## 2023-02-19 ENCOUNTER — Emergency Department (HOSPITAL_COMMUNITY): Payer: PPO

## 2023-02-19 ENCOUNTER — Inpatient Hospital Stay (HOSPITAL_COMMUNITY)
Admission: EM | Admit: 2023-02-19 | Discharge: 2023-02-22 | DRG: 872 | Disposition: A | Discharge status: Home Health | Payer: PPO | Attending: Family Medicine | Admitting: Family Medicine

## 2023-02-19 DIAGNOSIS — R6520 Severe sepsis without septic shock: Secondary | ICD-10-CM

## 2023-02-19 DIAGNOSIS — J4489 Other specified chronic obstructive pulmonary disease: Secondary | ICD-10-CM | POA: Diagnosis present

## 2023-02-19 DIAGNOSIS — N179 Acute kidney failure, unspecified: Secondary | ICD-10-CM | POA: Diagnosis present

## 2023-02-19 DIAGNOSIS — R0609 Other forms of dyspnea: Secondary | ICD-10-CM | POA: Insufficient documentation

## 2023-02-19 DIAGNOSIS — R140 Abdominal distension (gaseous): Secondary | ICD-10-CM | POA: Diagnosis present

## 2023-02-19 DIAGNOSIS — N10 Acute tubulo-interstitial nephritis: Secondary | ICD-10-CM | POA: Diagnosis present

## 2023-02-19 DIAGNOSIS — J45909 Unspecified asthma, uncomplicated: Secondary | ICD-10-CM | POA: Insufficient documentation

## 2023-02-19 DIAGNOSIS — R17 Unspecified jaundice: Secondary | ICD-10-CM

## 2023-02-19 DIAGNOSIS — I10 Essential (primary) hypertension: Secondary | ICD-10-CM | POA: Diagnosis present

## 2023-02-19 DIAGNOSIS — Z79899 Other long term (current) drug therapy: Secondary | ICD-10-CM

## 2023-02-19 DIAGNOSIS — Z8551 Personal history of malignant neoplasm of bladder: Secondary | ICD-10-CM

## 2023-02-19 DIAGNOSIS — A419 Sepsis, unspecified organism: Secondary | ICD-10-CM

## 2023-02-19 DIAGNOSIS — I959 Hypotension, unspecified: Secondary | ICD-10-CM | POA: Diagnosis present

## 2023-02-19 DIAGNOSIS — Z6841 Body Mass Index (BMI) 40.0 and over, adult: Secondary | ICD-10-CM

## 2023-02-19 DIAGNOSIS — E860 Dehydration: Secondary | ICD-10-CM | POA: Diagnosis present

## 2023-02-19 DIAGNOSIS — Z7951 Long term (current) use of inhaled steroids: Secondary | ICD-10-CM

## 2023-02-19 DIAGNOSIS — B965 Pseudomonas (aeruginosa) (mallei) (pseudomallei) as the cause of diseases classified elsewhere: Secondary | ICD-10-CM | POA: Diagnosis present

## 2023-02-19 DIAGNOSIS — Z87891 Personal history of nicotine dependence: Secondary | ICD-10-CM

## 2023-02-19 DIAGNOSIS — Z8616 Personal history of COVID-19: Secondary | ICD-10-CM

## 2023-02-19 DIAGNOSIS — Z801 Family history of malignant neoplasm of trachea, bronchus and lung: Secondary | ICD-10-CM

## 2023-02-19 DIAGNOSIS — N12 Tubulo-interstitial nephritis, not specified as acute or chronic: Principal | ICD-10-CM | POA: Diagnosis present

## 2023-02-19 DIAGNOSIS — N390 Urinary tract infection, site not specified: Secondary | ICD-10-CM | POA: Diagnosis present

## 2023-02-19 DIAGNOSIS — A4152 Sepsis due to Pseudomonas: Principal | ICD-10-CM | POA: Diagnosis present

## 2023-02-19 DIAGNOSIS — Z8249 Family history of ischemic heart disease and other diseases of the circulatory system: Secondary | ICD-10-CM

## 2023-02-19 DIAGNOSIS — Z7982 Long term (current) use of aspirin: Secondary | ICD-10-CM

## 2023-02-19 LAB — BASIC METABOLIC PANEL
Anion gap: 12 (ref 5–15)
BUN: 32 mg/dL — ABNORMAL HIGH (ref 8–23)
CO2: 21 mmol/L — ABNORMAL LOW (ref 22–32)
Calcium: 8.7 mg/dL — ABNORMAL LOW (ref 8.9–10.3)
Chloride: 103 mmol/L (ref 98–111)
Creatinine, Ser: 2.06 mg/dL — ABNORMAL HIGH (ref 0.61–1.24)
GFR, Estimated: 33 mL/min — ABNORMAL LOW (ref >60)
Glucose, Bld: 149 mg/dL — ABNORMAL HIGH (ref 70–99)
Potassium: 4.4 mmol/L (ref 3.5–5.1)
Sodium: 136 mmol/L (ref 135–145)

## 2023-02-19 LAB — CBG MONITORING, ED: Glucose-Capillary: 165 mg/dL — ABNORMAL HIGH (ref 70–99)

## 2023-02-19 LAB — CBC
HCT: 44.1 % (ref 39.0–52.0)
Hemoglobin: 14.4 g/dL (ref 13.0–17.0)
MCH: 31.3 pg (ref 26.0–34.0)
MCHC: 32.7 g/dL (ref 30.0–36.0)
MCV: 95.9 fL (ref 80.0–100.0)
Platelets: 224 10*3/uL (ref 150–400)
RBC: 4.6 MIL/uL (ref 4.22–5.81)
RDW: 16.8 % — ABNORMAL HIGH (ref 11.5–15.5)
WBC: 39.8 10*3/uL — ABNORMAL HIGH (ref 4.0–10.5)
nRBC: 0 % (ref 0.0–0.2)

## 2023-02-19 LAB — LACTIC ACID, PLASMA: Lactic Acid, Venous: 3.2 mmol/L (ref 0.5–1.9)

## 2023-02-19 LAB — D-DIMER, QUANTITATIVE: D-Dimer, Quant: 0.73 ug/mL-FEU — ABNORMAL HIGH (ref 0.00–0.50)

## 2023-02-19 MED ORDER — LACTATED RINGERS IV BOLUS (SEPSIS)
1000.0000 mL | Freq: Once | INTRAVENOUS | Status: AC
  Administered 2023-02-19: 1000 mL via INTRAVENOUS

## 2023-02-19 MED ORDER — SODIUM CHLORIDE 0.9 % IV SOLN
2.0000 g | Freq: Once | INTRAVENOUS | Status: AC
  Administered 2023-02-19: 2 g via INTRAVENOUS
  Filled 2023-02-19: qty 12.5

## 2023-02-19 MED ORDER — LACTATED RINGERS IV SOLN: INTRAVENOUS | Status: AC

## 2023-02-19 MED ORDER — IPRATROPIUM-ALBUTEROL 0.5-2.5 (3) MG/3ML IN SOLN
3.0000 mL | Freq: Once | RESPIRATORY_TRACT | Status: AC
  Administered 2023-02-19: 3 mL via RESPIRATORY_TRACT
  Filled 2023-02-19: qty 3

## 2023-02-19 MED ORDER — LACTATED RINGERS IV BOLUS (SEPSIS)
200.0000 mL | Freq: Once | INTRAVENOUS | Status: AC
  Administered 2023-02-20: 200 mL via INTRAVENOUS

## 2023-02-19 NOTE — Sepsis Progress Note (Signed)
Sepsis protocol is being followed by [facility]. 

## 2023-02-19 NOTE — ED Provider Notes (Signed)
[facility] Provider Note   CSN: [id number] Arrival date & time: 02/19/23  2151     History {Add pertinent medical, surgical, social history, OB history to HPI:1} Chief Complaint  Patient presents with   Fever    Manuel Castillo. is a 77 y.o. male.  The history is provided by the patient and medical records. No language interpreter was used.  Fever    77 year old male with significant history of COPD, hypertension, GERD, presenting to ED with complaints of not feeling well.  History obtained through patient and through daughter who is at bedside.  Patient previously diagnosed with bladder cancer and has had multiple treatment for his cancer with chemo and radiation.  Each time that he received treatment he would develop urinary tract infection requiring oral antibiotic.  He has been on multiple cycles of antibiotics for the past several months with the last antibiotic finished approximately 2 weeks ago.  He still having burning sensation with urination and then today he also endorsed having shortness of breath.  Patient mention he has history of sinus congestion but with his home medication he denies worsening sinus pain or congestion.  He does not endorse any significant cough or hemoptysis.  He does endorse having fever, burning urination and without increased frequency or urgency.  Family for his concerns of reoccurring UTI and felt that he may benefit from IV antibiotic.  Patient without any prior history of PE or DVT.  Home Medications Prior to Admission medications   Medication Sig Start Date End Date Taking? Authorizing Provider  albuterol (PROVENTIL HFA;VENTOLIN HFA) 108 (90 BASE) MCG/ACT inhaler Inhale 1-2 puffs into the lungs every 6 (six) hours as needed for wheezing. 03/06/13   Varney Biles, MD  aspirin EC 81 MG tablet Take 81 mg by mouth daily.    [provider]  blood glucose meter kit and supplies Dispense based on  patient and insurance preference. Use up to four times daily as directed. (FOR ICD-10 E10.9, E11.9). 12/14/19   Ghimire, Henreitta Leber, MD  fluticasone (FLONASE) 50 MCG/ACT nasal spray Place 1-2 sprays into both nostrils daily as needed (congestion.). 12/01/19   [provider]  fluticasone-salmeterol (ADVAIR DISKUS) 500-50 MCG/ACT AEPB Inhale 1 puff into the lungs in the morning and at bedtime. 07/11/22   Hunsucker, Bonna Gains, MD  Fluticasone-Umeclidin-Vilant (TRELEGY ELLIPTA) 200-62.5-25 MCG/ACT AEPB Inhale 1 puff into the lungs daily. 03/14/22   Hunsucker, Bonna Gains, MD  Fluticasone-Umeclidin-Vilant (TRELEGY ELLIPTA) 200-62.5-25 MCG/ACT AEPB Inhale 1 puff into the lungs daily. 06/13/22   Hunsucker, Bonna Gains, MD  ipratropium (ATROVENT) 0.06 % nasal spray SPRAY 2 SPRAYS INTO EACH NOSTRIL EVERY 6 HOURS AS NEEDED 01/23/21   [provider]  lisinopril (ZESTRIL) 20 MG tablet Take 20 mg by mouth daily. 06/15/19   [provider]  montelukast (SINGULAIR) 10 MG tablet Take 10 mg by mouth daily. 09/06/21   [provider]  Tiotropium Bromide Monohydrate (SPIRIVA RESPIMAT) 2.5 MCG/ACT AERS Inhale 2 puffs into the lungs daily. 07/11/22   Hunsucker, Bonna Gains, MD      Allergies    Patient has no known allergies.    Review of Systems   Review of Systems  Constitutional:  Positive for fever.  All other systems reviewed and are negative.   Physical Exam Updated Vital Signs BP 109/60 (BP Location: Right Arm)   Pulse 64   Temp 97.8 F (36.6 C) (Oral)   Resp (!) 22  SpO2 92%  Physical Exam Vitals and nursing note reviewed.  Constitutional:      General: He is not in acute distress.    Appearance: He is well-developed. He is obese.  HENT:     Head: Atraumatic.     Mouth/Throat:     Mouth: Mucous membranes are moist.  Eyes:     Conjunctiva/sclera: Conjunctivae normal.  Cardiovascular:     Rate and Rhythm: Normal rate and regular rhythm.  Pulmonary:     Comments:  Decreased breath sounds and mild tachypnea but no overt wheezes rales or rhonchi heard Abdominal:     Tenderness: There is no abdominal tenderness.     Comments: Protuberant abdomen nontender to palpation.  Musculoskeletal:     Cervical back: Neck supple.     Right lower leg: No edema.     Left lower leg: No edema.  Skin:    Findings: No rash.  Neurological:     Mental Status: He is alert.     ED Results / Procedures / Treatments   Labs (all labs ordered are listed, but only abnormal results are displayed) Labs Reviewed - No data to display  EKG None  Radiology No results found.  Procedures Procedures  {Document cardiac monitor, telemetry assessment procedure when appropriate:1}  Medications Ordered in ED Medications - No data to display  ED Course/ Medical Decision Making/ A&P   {   Click here for ABCD2, HEART and other calculatorsREFRESH Note before signing :1}                          Medical Decision Making  BP 109/60 (BP Location: Right Arm)   Pulse 64   Temp 97.8 F (36.6 C) (Oral)   Resp (!) 22   SpO2 92%   37:15 PM 77 year old male with significant history of COPD, hypertension, GERD, presenting to ED with complaints of not feeling well.  History obtained through patient and through daughter who is at bedside.  Patient previously diagnosed with bladder cancer and has had multiple treatment for his cancer with chemo and radiation.  Each time that he received treatment he would develop urinary tract infection requiring oral antibiotic.  He has been on multiple cycles of antibiotics for the past several months with the last antibiotic finished approximately 2 weeks ago.  He still having burning sensation with urination and then today he also endorsed having shortness of breath.  Patient mention he has history of sinus congestion but with his home medication he denies worsening sinus pain or congestion.  He does not endorse any significant cough or hemoptysis.  He  does endorse having fever, burning urination and without increased frequency or urgency.  Family for his concerns of reoccurring UTI and felt that he may benefit from IV antibiotic.  Patient without any prior history of PE or DVT.  On exam this is an obese male laying in bed appears mildly tachypneic.  Heart with normal rate and rhythm, lungs with decreased breath sounds but no overt wheezes rales or rhonchi heard.  Patient has a protuberant abdomen but nontender to palpation.  No significant peripheral edema no JVD.  Is able to move all 4 extremities, and is mentating at baseline.  Vital sign review and overall reassuring, mild tachypnea, no fever, O2 sat is at 92% on room air.  -Labs ordered, independently viewed and interpreted by me.  Labs remarkable for *** -The patient was maintained on a cardiac monitor.  I personally viewed and interpreted the cardiac monitored which showed an underlying rhythm of: *** -Imaging independently viewed and interpreted by me and I agree with radiologist's interpretation.  Result remarkable for *** -This patient presents to the ED for concern of ***, this involves an extensive number of treatment options, and is a complaint that carries with it a high risk of complications and morbidity.  The differential diagnosis includes *** -Co morbidities that complicate the patient evaluation includes *** -Treatment includes *** -Reevaluation of the patient after these medicines showed that the patient {resolved/improved/worsened:23923::"improved"} -PCP office notes or outside notes reviewed -Discussion with specialist *** -Escalation to admission/observation considered: patients feels much better, is comfortable with discharge, and will follow up with PCP -Prescription medication considered, patient comfortable with *** -Social Determinant of Health considered which includes ***   {Document critical care time when appropriate:1} {Document review of labs and clinical  decision tools ie heart score, Chads2Vasc2 etc:1}  {Document your independent review of radiology images, and any outside records:1} {Document your discussion with family members, caretakers, and with consultants:1} {Document social determinants of health affecting pt's care:1} {Document your decision making why or why not admission, treatments were needed:1} Final Clinical Impression(s) / ED Diagnoses Final diagnoses:  None    Rx / DC Orders ED Discharge Orders     None

## 2023-02-19 NOTE — ED Triage Notes (Signed)
Pt has not been feeling well since last night, having sob that's worse than baseline. C/o R flank pain

## 2023-02-20 ENCOUNTER — Encounter (HOSPITAL_COMMUNITY): Payer: Self-pay

## 2023-02-20 ENCOUNTER — Emergency Department (HOSPITAL_COMMUNITY): Payer: PPO

## 2023-02-20 DIAGNOSIS — Z8551 Personal history of malignant neoplasm of bladder: Secondary | ICD-10-CM | POA: Diagnosis not present

## 2023-02-20 DIAGNOSIS — Z6841 Body Mass Index (BMI) 40.0 and over, adult: Secondary | ICD-10-CM | POA: Diagnosis not present

## 2023-02-20 DIAGNOSIS — R140 Abdominal distension (gaseous): Secondary | ICD-10-CM | POA: Diagnosis present

## 2023-02-20 DIAGNOSIS — N10 Acute tubulo-interstitial nephritis: Secondary | ICD-10-CM | POA: Diagnosis present

## 2023-02-20 DIAGNOSIS — Z79899 Other long term (current) drug therapy: Secondary | ICD-10-CM | POA: Diagnosis not present

## 2023-02-20 DIAGNOSIS — A419 Sepsis, unspecified organism: Secondary | ICD-10-CM | POA: Diagnosis present

## 2023-02-20 DIAGNOSIS — Z8616 Personal history of COVID-19: Secondary | ICD-10-CM | POA: Diagnosis not present

## 2023-02-20 DIAGNOSIS — R0602 Shortness of breath: Secondary | ICD-10-CM | POA: Diagnosis not present

## 2023-02-20 DIAGNOSIS — E860 Dehydration: Secondary | ICD-10-CM | POA: Diagnosis present

## 2023-02-20 DIAGNOSIS — N390 Urinary tract infection, site not specified: Secondary | ICD-10-CM | POA: Diagnosis present

## 2023-02-20 DIAGNOSIS — Z801 Family history of malignant neoplasm of trachea, bronchus and lung: Secondary | ICD-10-CM | POA: Diagnosis not present

## 2023-02-20 DIAGNOSIS — Z7951 Long term (current) use of inhaled steroids: Secondary | ICD-10-CM | POA: Diagnosis not present

## 2023-02-20 DIAGNOSIS — N12 Tubulo-interstitial nephritis, not specified as acute or chronic: Secondary | ICD-10-CM | POA: Diagnosis not present

## 2023-02-20 DIAGNOSIS — B965 Pseudomonas (aeruginosa) (mallei) (pseudomallei) as the cause of diseases classified elsewhere: Secondary | ICD-10-CM | POA: Diagnosis present

## 2023-02-20 DIAGNOSIS — I959 Hypotension, unspecified: Secondary | ICD-10-CM | POA: Diagnosis present

## 2023-02-20 DIAGNOSIS — R17 Unspecified jaundice: Secondary | ICD-10-CM

## 2023-02-20 DIAGNOSIS — N179 Acute kidney failure, unspecified: Secondary | ICD-10-CM | POA: Diagnosis present

## 2023-02-20 DIAGNOSIS — I10 Essential (primary) hypertension: Secondary | ICD-10-CM | POA: Diagnosis present

## 2023-02-20 DIAGNOSIS — R6520 Severe sepsis without septic shock: Secondary | ICD-10-CM | POA: Diagnosis present

## 2023-02-20 DIAGNOSIS — Z7982 Long term (current) use of aspirin: Secondary | ICD-10-CM | POA: Diagnosis not present

## 2023-02-20 DIAGNOSIS — J4530 Mild persistent asthma, uncomplicated: Secondary | ICD-10-CM | POA: Diagnosis not present

## 2023-02-20 DIAGNOSIS — J4489 Other specified chronic obstructive pulmonary disease: Secondary | ICD-10-CM | POA: Diagnosis present

## 2023-02-20 DIAGNOSIS — Z8249 Family history of ischemic heart disease and other diseases of the circulatory system: Secondary | ICD-10-CM | POA: Diagnosis not present

## 2023-02-20 DIAGNOSIS — A4152 Sepsis due to Pseudomonas: Secondary | ICD-10-CM | POA: Diagnosis present

## 2023-02-20 DIAGNOSIS — Z87891 Personal history of nicotine dependence: Secondary | ICD-10-CM | POA: Diagnosis not present

## 2023-02-20 LAB — COMPREHENSIVE METABOLIC PANEL
ALT: 21 U/L (ref 0–44)
AST: 22 U/L (ref 15–41)
Albumin: 3.3 g/dL — ABNORMAL LOW (ref 3.5–5.0)
Alkaline Phosphatase: 47 U/L (ref 38–126)
Anion gap: 10 (ref 5–15)
BUN: 34 mg/dL — ABNORMAL HIGH (ref 8–23)
CO2: 21 mmol/L — ABNORMAL LOW (ref 22–32)
Calcium: 8.2 mg/dL — ABNORMAL LOW (ref 8.9–10.3)
Chloride: 103 mmol/L (ref 98–111)
Creatinine, Ser: 1.61 mg/dL — ABNORMAL HIGH (ref 0.61–1.24)
GFR, Estimated: 44 mL/min — ABNORMAL LOW (ref >60)
Glucose, Bld: 152 mg/dL — ABNORMAL HIGH (ref 70–99)
Potassium: 4.1 mmol/L (ref 3.5–5.1)
Sodium: 134 mmol/L — ABNORMAL LOW (ref 135–145)
Total Bilirubin: 2.1 mg/dL — ABNORMAL HIGH (ref 0.3–1.2)
Total Protein: 6.3 g/dL — ABNORMAL LOW (ref 6.5–8.1)

## 2023-02-20 LAB — URINALYSIS, ROUTINE W REFLEX MICROSCOPIC
Bilirubin Urine: NEGATIVE
Glucose, UA: NEGATIVE mg/dL
Ketones, ur: 5 mg/dL — AB
Nitrite: NEGATIVE
Protein, ur: >=300 mg/dL — AB
RBC / HPF: >50 RBC/hpf (ref 0–5)
Specific Gravity, Urine: 1.026 (ref 1.005–1.030)
WBC, UA: >50 WBC/hpf (ref 0–5)
pH: 5 (ref 5.0–8.0)

## 2023-02-20 LAB — PROTIME-INR
INR: 1.2 (ref 0.8–1.2)
Prothrombin Time: 14.8 seconds (ref 11.4–15.2)

## 2023-02-20 LAB — CBC WITH DIFFERENTIAL/PLATELET
Abs Immature Granulocytes: 0.48 10*3/uL — ABNORMAL HIGH (ref 0.00–0.07)
Basophils Absolute: 0.1 10*3/uL (ref 0.0–0.1)
Basophils Relative: 0 %
Eosinophils Absolute: 0.1 10*3/uL (ref 0.0–0.5)
Eosinophils Relative: 0 %
HCT: 38.8 % — ABNORMAL LOW (ref 39.0–52.0)
Hemoglobin: 12.8 g/dL — ABNORMAL LOW (ref 13.0–17.0)
Immature Granulocytes: 2 %
Lymphocytes Relative: 5 %
Lymphs Abs: 1.5 10*3/uL (ref 0.7–4.0)
MCH: 31.4 pg (ref 26.0–34.0)
MCHC: 33 g/dL (ref 30.0–36.0)
MCV: 95.1 fL (ref 80.0–100.0)
Monocytes Absolute: 3 10*3/uL — ABNORMAL HIGH (ref 0.1–1.0)
Monocytes Relative: 9 %
Neutro Abs: 27.7 10*3/uL — ABNORMAL HIGH (ref 1.7–7.7)
Neutrophils Relative %: 84 %
Platelets: 175 10*3/uL (ref 150–400)
RBC: 4.08 MIL/uL — ABNORMAL LOW (ref 4.22–5.81)
RDW: 17.2 % — ABNORMAL HIGH (ref 11.5–15.5)
WBC: 32.9 10*3/uL — ABNORMAL HIGH (ref 4.0–10.5)
nRBC: 0 % (ref 0.0–0.2)

## 2023-02-20 LAB — HEPATIC FUNCTION PANEL
ALT: 24 U/L (ref 0–44)
AST: 27 U/L (ref 15–41)
Albumin: 3.7 g/dL (ref 3.5–5.0)
Alkaline Phosphatase: 49 U/L (ref 38–126)
Bilirubin, Direct: 0.4 mg/dL — ABNORMAL HIGH (ref 0.0–0.2)
Indirect Bilirubin: 1.8 mg/dL — ABNORMAL HIGH (ref 0.3–0.9)
Total Bilirubin: 2.2 mg/dL — ABNORMAL HIGH (ref 0.3–1.2)
Total Protein: 6.7 g/dL (ref 6.5–8.1)

## 2023-02-20 LAB — LACTIC ACID, PLASMA
Lactic Acid, Venous: 3.4 mmol/L (ref 0.5–1.9)
Lactic Acid, Venous: 3.2 mmol/L (ref 0.5–1.9)
Lactic Acid, Venous: 2.5 mmol/L (ref 0.5–1.9)

## 2023-02-20 LAB — LIPASE, BLOOD: Lipase: 20 U/L (ref 11–51)

## 2023-02-20 MED ORDER — SODIUM CHLORIDE 0.9 % IV SOLN
2.0000 g | Freq: Two times a day (BID) | INTRAVENOUS | Status: DC
  Administered 2023-02-20 (×2): 2 g via INTRAVENOUS
  Filled 2023-02-20 (×2): qty 12.5

## 2023-02-20 MED ORDER — ALBUTEROL SULFATE (2.5 MG/3ML) 0.083% IN NEBU
2.5000 mg | INHALATION_SOLUTION | Freq: Four times a day (QID) | RESPIRATORY_TRACT | Status: DC | PRN
  Administered 2023-02-20: 2.5 mg via RESPIRATORY_TRACT
  Filled 2023-02-20: qty 3

## 2023-02-20 MED ORDER — MONTELUKAST SODIUM 10 MG PO TABS
10.0000 mg | ORAL_TABLET | Freq: Every day | ORAL | Status: DC
  Administered 2023-02-21 – 2023-02-22 (×2): 10 mg via ORAL
  Filled 2023-02-20 (×3): qty 1

## 2023-02-20 MED ORDER — IPRATROPIUM-ALBUTEROL 0.5-2.5 (3) MG/3ML IN SOLN
3.0000 mL | RESPIRATORY_TRACT | Status: DC
  Administered 2023-02-20: 3 mL via RESPIRATORY_TRACT
  Filled 2023-02-20: qty 3

## 2023-02-20 MED ORDER — FLUTICASONE FUROATE-VILANTEROL 200-25 MCG/ACT IN AEPB
1.0000 | INHALATION_SPRAY | Freq: Every day | RESPIRATORY_TRACT | Status: DC
  Administered 2023-02-22: 1 via RESPIRATORY_TRACT
  Filled 2023-02-20 (×2): qty 28

## 2023-02-20 MED ORDER — UMECLIDINIUM BROMIDE 62.5 MCG/ACT IN AEPB
1.0000 | INHALATION_SPRAY | Freq: Every day | RESPIRATORY_TRACT | Status: DC
  Filled 2023-02-20 (×2): qty 7

## 2023-02-20 MED ORDER — HYDRALAZINE HCL 25 MG PO TABS: 25.0000 mg | ORAL_TABLET | Freq: Four times a day (QID) | ORAL | Status: DC | PRN

## 2023-02-20 MED ORDER — ENOXAPARIN SODIUM 40 MG/0.4ML IJ SOSY
40.0000 mg | PREFILLED_SYRINGE | INTRAMUSCULAR | Status: DC
  Administered 2023-02-20: 40 mg via SUBCUTANEOUS
  Filled 2023-02-20: qty 0.4

## 2023-02-20 MED ORDER — ACETAMINOPHEN 325 MG PO TABS
650.0000 mg | ORAL_TABLET | Freq: Four times a day (QID) | ORAL | Status: DC | PRN
  Administered 2023-02-21: 650 mg via ORAL

## 2023-02-20 MED ORDER — ACETAMINOPHEN 650 MG RE SUPP: 650.0000 mg | Freq: Four times a day (QID) | RECTAL | Status: DC | PRN

## 2023-02-20 MED ORDER — ENOXAPARIN SODIUM 60 MG/0.6ML IJ SOSY
60.0000 mg | PREFILLED_SYRINGE | INTRAMUSCULAR | Status: DC
  Administered 2023-02-21 – 2023-02-22 (×2): 60 mg via SUBCUTANEOUS
  Filled 2023-02-20 (×2): qty 0.6

## 2023-02-20 NOTE — ED Notes (Signed)
Placed on 2L [place] due to sats 88-89%

## 2023-02-20 NOTE — Progress Notes (Signed)
Pharmacy Antibiotic Note  Manuel Castillo. is a 77 y.o. male admitted on 02/19/2023 with burning sensation with urination also endorses SOB. Pt previously diagnosed with bladder cancer and has had multiple treatments with chemo and radiation.Marland Kitchen  Pharmacy has been consulted for cefepime dosing.  Plan: Cefepime 2gm IV q12h Follow renal function, cultures and clinical course  Height: 5\' 8"  (172.7 cm) Weight: 124.2 kg (273 lb 12.8 oz) IBW/kg (Calculated) : 68.4  Temp (24hrs), Avg:97.9 F (36.6 C), Min:97.8 F (36.6 C), Max:98 F (36.7 C)  Recent Labs  Lab 02/19/23 2236  WBC 39.8*  CREATININE 2.06*  LATICACIDVEN 3.2*    Estimated Creatinine Clearance: 39.1 mL/min (A) (by C-G formula based on SCr of 2.06 mg/dL (H)).    No Known Allergies   Thank you for allowing pharmacy to be a part of this patient's care.  Dolly Rias RPh 02/20/2023, 2:31 AM

## 2023-02-20 NOTE — ED Notes (Signed)
Pt refused in and out cath. Pt wants to see if the fluids help with urination.

## 2023-02-20 NOTE — ED Notes (Signed)
ED TO INPATIENT HANDOFF REPORT  ED Nurse Name and Phone #: [phone]  S Name/Age/Gender Manuel Castillo. 77 y.o. male Room/Bed: WA09/WA09  Code Status   Code Status: Full Code  Home/SNF/Other Home Patient oriented to: self, place, time, and situation Is this baseline? Yes   Triage Complete: Triage complete  Chief Complaint Acute pyelonephritis [N10]  Triage Note Pt has not been feeling well since last night, having sob that's worse than baseline. C/o R flank pain    Allergies No Known Allergies  Level of Care/Admitting Diagnosis ED Disposition     ED Disposition  Admit   Condition  --   Comment  Hospital Area: [facility] [[id number]]  Level of Care: Progressive [102]  Admit to Progressive based on following criteria: MULTISYSTEM THREATS such as stable sepsis, metabolic/electrolyte imbalance with or without encephalopathy that is responding to early treatment.  May [facility] patient to [facility] or [facility] if equivalent level of care is available:: Yes  Covid Evaluation: Asymptomatic - no recent exposure (last 10 days) testing not required  Diagnosis: Acute pyelonephritis [id number]  Admitting Physician: Shela Leff [id number]  Attending Physician: Shela Leff [id number]  Certification:: I certify this patient will need inpatient services for at least 2 midnights  Estimated Length of Stay: 2          B Medical/Surgery History Past Medical History:  Diagnosis Date   COVID-19    Past Surgical History:  Procedure Laterality Date   HERNIA REPAIR       A IV Location/Drains/Wounds Patient Lines/Drains/Airways Status     Active Line/Drains/Airways     Name Placement date Placement time Site Days   Peripheral IV 02/19/23 20 G Right Antecubital 02/19/23  2238  Antecubital  1   Peripheral IV 02/20/23 20 G Anterior;Left Hand 02/20/23  0147  Hand  less than 1            Intake/Output Last 24 hours  Intake/Output Summary  (Last 24 hours) at 02/20/2023 0907 Last data filed at 02/20/2023 0227 Gross per 24 hour  Intake 2300 ml  Output --  Net 2300 ml    Labs/Imaging Results for orders placed or performed during the hospital encounter of 02/19/23 (from the past 48 hour(s))  CBG monitoring, ED     Status: Abnormal   Collection Time: 02/19/23 10:35 PM  Result Value Ref Range   Glucose-Capillary 165 (H) 70 - 99 mg/dL    Comment: Glucose reference range applies only to samples taken after fasting for at least 8 hours.  Basic metabolic panel     Status: Abnormal   Collection Time: 02/19/23 10:36 PM  Result Value Ref Range   Sodium 136 135 - 145 mmol/L   Potassium 4.4 3.5 - 5.1 mmol/L   Chloride 103 98 - 111 mmol/L   CO2 21 (L) 22 - 32 mmol/L   Glucose, Bld 149 (H) 70 - 99 mg/dL    Comment: Glucose reference range applies only to samples taken after fasting for at least 8 hours.   BUN 32 (H) 8 - 23 mg/dL   Creatinine, Ser 2.06 (H) 0.61 - 1.24 mg/dL   Calcium 8.7 (L) 8.9 - 10.3 mg/dL   GFR, Estimated 33 (L) >60 mL/min    Comment: (NOTE) Calculated using the CKD-EPI Creatinine Equation (2021)    Anion gap 12 5 - 15    Comment: Performed at [facility], [place] [address], [place], [place] [address]  CBC  Status: Abnormal   Collection Time: 02/19/23 10:36 PM  Result Value Ref Range   WBC 39.8 (H) 4.0 - 10.5 K/uL   RBC 4.60 4.22 - 5.81 MIL/uL   Hemoglobin 14.4 13.0 - 17.0 g/dL   HCT 44.1 39.0 - 52.0 %   MCV 95.9 80.0 - 100.0 fL   MCH 31.3 26.0 - 34.0 pg   MCHC 32.7 30.0 - 36.0 g/dL   RDW 16.8 (H) 11.5 - 15.5 %   Platelets 224 150 - 400 K/uL   nRBC 0.0 0.0 - 0.2 %    Comment: Performed at [facility], [place] [address] [place], [place] [address]  D-dimer, quantitative     Status: Abnormal   Collection Time: 02/19/23 10:36 PM  Result Value Ref Range   D-Dimer, Quant 0.73 (H) 0.00 - 0.50 ug/mL-FEU    Comment: (NOTE) At the manufacturer cut-off value of 0.5 g/mL FEU,  this assay has a negative predictive value of 95-100%.This assay is intended for use in conjunction with a clinical pretest probability (PTP) assessment model to exclude pulmonary embolism (PE) and deep venous thrombosis (DVT) in outpatients suspected of PE or DVT. Results should be correlated with clinical presentation. Performed at [facility], [place] [address], [place], [place] [address]   Lactic acid, plasma     Status: Abnormal   Collection Time: 02/19/23 10:36 PM  Result Value Ref Range   Lactic Acid, Venous 3.2 (HH) 0.5 - 1.9 mmol/L    Comment: CRITICAL RESULT CALLED TO, READ BACK BY AND VERIFIED WITH Lamona Curl, RN 02/19/23 2314 BY Raliegh Ip. DAVIS Performed at [facility] [address] [place], [place] [address]   Hepatic function panel     Status: Abnormal   Collection Time: 02/19/23 10:36 PM  Result Value Ref Range   Total Protein 6.7 6.5 - 8.1 g/dL   Albumin 3.7 3.5 - 5.0 g/dL   AST 27 15 - 41 U/L   ALT 24 0 - 44 U/L   Alkaline Phosphatase 49 38 - 126 U/L   Total Bilirubin 2.2 (H) 0.3 - 1.2 mg/dL   Bilirubin, Direct 0.4 (H) 0.0 - 0.2 mg/dL   Indirect Bilirubin 1.8 (H) 0.3 - 0.9 mg/dL    Comment: Performed at [facility], [place] [address] [place], [place] [address]  Protime-INR     Status: None   Collection Time: 02/19/23 10:36 PM  Result Value Ref Range   Prothrombin Time 14.8 11.4 - 15.2 seconds   INR 1.2 0.8 - 1.2    Comment: (NOTE) INR goal varies based on device and disease states. Performed at [facility], [place] [address] [place], [place] [address]   Lipase, blood     Status: None   Collection Time: 02/19/23 10:36 PM  Result Value Ref Range   Lipase 20 11 - 51 U/L    Comment: Performed at [facility], [place] [address], [place], [place] [address]  Lactic acid, plasma     Status: Abnormal   Collection Time: 02/20/23  1:43 AM  Result Value Ref Range   Lactic Acid,  Venous 3.4 (HH) 0.5 - 1.9 mmol/L    Comment: CRITICAL VALUE NOTED. VALUE IS CONSISTENT WITH PREVIOUSLY REPORTED/CALLED VALUE Performed at [facility] [address], [place], [place] [address]   Culture, blood (single)     Status: None (Preliminary result)   Collection Time: 02/20/23  1:45 AM   Specimen: BLOOD  Result  Value Ref Range   Specimen Description      BLOOD RIGHT ANTECUBITAL Performed at [facility] [address], [place], [place] [address]    Special Requests      BOTTLES DRAWN AEROBIC AND ANAEROBIC Blood Culture adequate volume Performed at [facility] [address] [place], [place] [address]    Culture      NO GROWTH <12 HOURS Performed at [facility] [address], [place], [place] [address]    Report Status PENDING   Urinalysis, Routine w reflex microscopic -Urine, Clean Catch     Status: Abnormal   Collection Time: 02/20/23  1:54 AM  Result Value Ref Range   Color, Urine AMBER (A) YELLOW    Comment: BIOCHEMICALS MAY BE AFFECTED BY COLOR   APPearance CLOUDY (A) CLEAR   Specific Gravity, Urine 1.026 1.005 - 1.030   pH 5.0 5.0 - 8.0   Glucose, UA NEGATIVE NEGATIVE mg/dL   Hgb urine dipstick MODERATE (A) NEGATIVE   Bilirubin Urine NEGATIVE NEGATIVE   Ketones, ur 5 (A) NEGATIVE mg/dL   Protein, ur >=300 (A) NEGATIVE mg/dL   Nitrite NEGATIVE NEGATIVE   Leukocytes,Ua MODERATE (A) NEGATIVE   RBC / HPF >50 0 - 5 RBC/hpf   WBC, UA >50 0 - 5 WBC/hpf   Bacteria, UA MANY (A) NONE SEEN   Squamous Epithelial / HPF 0-5 0 - 5 /HPF   Mucus PRESENT    Hyaline Casts, UA PRESENT    Granular Casts, UA PRESENT     Comment: Performed at [facility], [place] [address], [place], [place] [address]  Lactic acid, plasma     Status: Abnormal   Collection Time: 02/20/23  4:24 AM  Result Value Ref Range   Lactic Acid, Venous 3.2 (HH) 0.5 - 1.9 mmol/L    Comment: CRITICAL VALUE NOTED. VALUE IS  CONSISTENT WITH PREVIOUSLY REPORTED/CALLED VALUE Performed at [facility] [address], [place], [place] [address]   CBC with Differential/Platelet     Status: Abnormal   Collection Time: 02/20/23  7:23 AM  Result Value Ref Range   WBC 32.9 (H) 4.0 - 10.5 K/uL   RBC 4.08 (L) 4.22 - 5.81 MIL/uL   Hemoglobin 12.8 (L) 13.0 - 17.0 g/dL   HCT 38.8 (L) 39.0 - 52.0 %   MCV 95.1 80.0 - 100.0 fL   MCH 31.4 26.0 - 34.0 pg   MCHC 33.0 30.0 - 36.0 g/dL   RDW 17.2 (H) 11.5 - 15.5 %   Platelets 175 150 - 400 K/uL   nRBC 0.0 0.0 - 0.2 %   Neutrophils Relative % 84 %   Neutro Abs 27.7 (H) 1.7 - 7.7 K/uL   Lymphocytes Relative 5 %   Lymphs Abs 1.5 0.7 - 4.0 K/uL   Monocytes Relative 9 %   Monocytes Absolute 3.0 (H) 0.1 - 1.0 K/uL   Eosinophils Relative 0 %   Eosinophils Absolute 0.1 0.0 - 0.5 K/uL   Basophils Relative 0 %   Basophils Absolute 0.1 0.0 - 0.1 K/uL   WBC Morphology VACUOLATED NEUTROPHILS    Immature Granulocytes 2 %   Abs Immature Granulocytes 0.48 (H) 0.00 - 0.07 K/uL    Comment: Performed at [facility], [place] [address] [place], [place] [address]  Comprehensive metabolic panel     Status: Abnormal   Collection Time: 02/20/23  7:23 AM  Result Value Ref Range   Sodium 134 (L)  135 - 145 mmol/L   Potassium 4.1 3.5 - 5.1 mmol/L   Chloride 103 98 - 111 mmol/L   CO2 21 (L) 22 - 32 mmol/L   Glucose, Bld 152 (H) 70 - 99 mg/dL    Comment: Glucose reference range applies only to samples taken after fasting for at least 8 hours.   BUN 34 (H) 8 - 23 mg/dL   Creatinine, Ser 1.61 (H) 0.61 - 1.24 mg/dL   Calcium 8.2 (L) 8.9 - 10.3 mg/dL   Total Protein 6.3 (L) 6.5 - 8.1 g/dL   Albumin 3.3 (L) 3.5 - 5.0 g/dL   AST 22 15 - 41 U/L   ALT 21 0 - 44 U/L   Alkaline Phosphatase 47 38 - 126 U/L   Total Bilirubin 2.1 (H) 0.3 - 1.2 mg/dL   GFR, Estimated 44 (L) >60 mL/min    Comment: (NOTE) Calculated using the CKD-EPI Creatinine Equation (2021)     Anion gap 10 5 - 15    Comment: Performed at [facility], [place] [address] [place], [place] [address]  Lactic acid, plasma     Status: Abnormal   Collection Time: 02/20/23  7:23 AM  Result Value Ref Range   Lactic Acid, Venous 2.5 (HH) 0.5 - 1.9 mmol/L    Comment: CRITICAL VALUE NOTED. VALUE IS CONSISTENT WITH PREVIOUSLY REPORTED/CALLED VALUE Performed at [facility] [address] [place], [place] [address]    CT Renal Stone Study  Result Date: 02/20/2023 CLINICAL DATA:  Abdominal pain.  Concern for kidney stone. EXAM: CT ABDOMEN AND PELVIS WITHOUT CONTRAST TECHNIQUE: Multidetector CT imaging of the abdomen and pelvis was performed following the standard protocol without IV contrast. RADIATION DOSE REDUCTION: This exam was performed according to the departmental dose-optimization program which includes automated exposure control, adjustment of the mA and/or kV according to patient size and/or use of iterative reconstruction technique. COMPARISON:  CT abdomen pelvis dated 07/17/2019. FINDINGS: Evaluation of this exam is limited in the absence of intravenous contrast. Lower chest: The visualized lung bases are clear. There is coronary vascular calcification. No intra-abdominal free air or free fluid. Hepatobiliary: Fatty liver. No biliary dilatation. The gallbladder is unremarkable. Pancreas: Unremarkable. No pancreatic ductal dilatation or surrounding inflammatory changes. Spleen: Normal in size without focal abnormality. Adrenals/Urinary Tract: The adrenal glands are unremarkable. There is no hydronephrosis or nephrolithiasis on either side. There is a 2 cm exophytic cyst arising from the interpolar right kidney. There is right perinephric stranding which may be related to cyst rupture or infection. Correlation with urinalysis recommended. The visualized ureters appear unremarkable. The urinary bladder is minimally distended. Small foci of air within the bladder  may have been introduced by recent instrumentation. Clinical correlation is recommended. Stomach/Bowel: There is sigmoid diverticulosis and scattered colonic diverticula without active inflammatory changes. There is no bowel obstruction or active inflammation. The appendix is normal. Vascular/Lymphatic: Mild aortoiliac atherosclerotic disease. The IVC is unremarkable. No portal venous gas. There is no adenopathy. Reproductive: The prostate and seminal vesicles are grossly unremarkable. No pelvic mass. Other: Prior bilateral inguinal hernia repair. Partially visualized 1.5 x 3.5 cm ovoid low attenuating structure in the right inguinal canal is not characterized on this CT but appears similar to prior CT and may represent a chronic collection or an area of fat necrosis. Musculoskeletal: Disc desiccation at L5-S1. No acute osseous pathology. IMPRESSION: 1. No hydronephrosis or nephrolithiasis. 2. Right perinephric stranding may be related to cyst rupture or infection. Correlation with  urinalysis recommended. 3. Colonic diverticulosis. No bowel obstruction. Normal appendix. 4. Fatty liver. 5.  Aortic Atherosclerosis (ICD10-I70.0). Electronically Signed   By: Anner Crete M.D.   On: 02/20/2023 01:20   DG Chest 2 View  Result Date: 02/19/2023 CLINICAL DATA:  Dyspnea EXAM: CHEST - 2 VIEW COMPARISON:  12/08/2019 FINDINGS: The heart size and mediastinal contours are within normal limits. Both lungs are clear. The visualized skeletal structures are unremarkable. IMPRESSION: No active cardiopulmonary disease. Electronically Signed   By: Fidela Salisbury M.D.   On: 02/19/2023 23:06    Pending Labs Unresulted Labs (From admission, onward)     Start     Ordered   02/20/23 [phone]  Urine Culture (for pregnant, neutropenic or urologic patients or patients with an indwelling urinary catheter)  (Urine Labs)  Add-on,   AD       Question:  Indication  Answer:  Sepsis   02/20/23 0651            Vitals/Pain Today's  Vitals   02/20/23 0700 02/20/23 0727 02/20/23 0800 02/20/23 0840  BP: 110/73  (!) 127/57   Pulse: 68  67 81  Resp: (!) 25  (!) 25 (!) 22  Temp:  99.4 F (37.4 C)    TempSrc:  Oral    SpO2: 94%  (!) 89% 94%  Weight:      Height:      PainSc:  0-No pain      Isolation Precautions No active isolations  Medications Medications  lactated ringers infusion ( Intravenous New Bag/Given 02/20/23 0726)  ceFEPIme (MAXIPIME) 2 g in sodium chloride 0.9 % 100 mL IVPB (has no administration in time range)  albuterol (PROVENTIL) (2.5 MG/3ML) 0.083% nebulizer solution 2.5 mg (has no administration in time range)  fluticasone furoate-vilanterol (BREO ELLIPTA) 200-25 MCG/ACT 1 puff (1 puff Inhalation Not Given 02/20/23 0759)    And  umeclidinium bromide (INCRUSE ELLIPTA) 62.5 MCG/ACT 1 puff (1 puff Inhalation Not Given 02/20/23 0759)  montelukast (SINGULAIR) tablet 10 mg (has no administration in time range)  enoxaparin (LOVENOX) injection 40 mg (40 mg Subcutaneous Given 02/20/23 0831)  acetaminophen (TYLENOL) tablet 650 mg (has no administration in time range)    Or  acetaminophen (TYLENOL) suppository 650 mg (has no administration in time range)  ipratropium-albuterol (DUONEB) 0.5-2.5 (3) MG/3ML nebulizer solution 3 mL (3 mLs Nebulization Given 02/19/23 2352)  lactated ringers bolus 1,000 mL (0 mLs Intravenous Stopped 02/20/23 0147)    And  lactated ringers bolus 1,000 mL (0 mLs Intravenous Stopped 02/20/23 0147)    And  lactated ringers bolus 200 mL (0 mLs Intravenous Stopped 02/20/23 0227)  ceFEPIme (MAXIPIME) 2 g in sodium chloride 0.9 % 100 mL IVPB (0 g Intravenous Stopped 02/20/23 0147)    Mobility walks     Focused Assessments Renal Assessment Handoff:  pyleo  , Pulmonary Assessment Handoff:  Lung sounds:  diminished 2L [place] sats varied from 88-92% does not wear oxygen at home      R Recommendations: See Admitting Provider Note  Report given to:   Additional Notes: [id number]

## 2023-02-20 NOTE — TOC Progression Note (Signed)
Transition of Care (TOC) - Progression Note    Patient Details  Name: Manuel Castillo. MRN: [id number] Date of Birth: 1946/07/10  Transition of Care [facility]) CM/SW Contact  Purcell Mouton, RN Phone Number: 02/20/2023, 1:24 PM  Clinical Narrative:     Pt is at home with wife. TOC will continue to follow for discharge needs.   Expected Discharge Plan: [place] Barriers to Discharge: No Barriers Identified  Expected Discharge Plan and Services       Living arrangements for the past 2 months: Single Family Home                                       Social Determinants of Health (SDOH) Interventions SDOH Screenings   Tobacco Use: Medium Risk (02/20/2023)    Readmission Risk Interventions     No data to display

## 2023-02-20 NOTE — Sepsis Progress Note (Signed)
Notified bedside nurse of need to draw repeat lactic acid per sepsis protocol since the 2nd lactic acid is trending up.

## 2023-02-20 NOTE — Progress Notes (Signed)
Spoke w/ unit RN for ML order; stated pt has one good piv at this time and only getting one dose of abt for this shift. No need for midline at this time. Check back during day shift if midline is still needed.Informed RN to reach out if needing a second access before end of shift.

## 2023-02-20 NOTE — H&P (Signed)
History and Physical    Manuel Castillo. [id number] DOB: 05/24/1946 DOA: 02/19/2023  PCP: [place]  Patient coming from: Home  Chief Complaint: Shortness of breath  HPI: Manuel Castillo. is a 77 y.o. male with medical history significant of asthma, hypertension, COVID-pneumonia in January 2021, bladder cancer, recurrent UTI presented to ED with complaints of shortness of breath and burning with urination.  Vital signs on arrival to the ED: Temperature 97.8 F, pulse 64, respiratory rate 22, blood pressure 109/60, SpO2 92% on room air.  CBC without differential significant for WBC 39.8.  Metabolic panel significant for bicarb 21, anion gap 12, glucose 149, BUN 32, creatinine 2.0, T. bili 2.2 with normal remainder of LFTs.  Lipase normal.  Age-adjusted D-dimer within normal range.  Lactic acid 3.2> 3.4.  Single set of blood culture drawn.  UA showing moderate leukocytes and microscopy showing >50 RBCs, >50 WBCs, and many bacteria.  Chest x-ray showing no active cardiopulmonary disease.  CT renal stone study negative for hydronephrosis or nephrolithiasis.  Showing right perinephric stranding which may be related to cyst rupture or infection.  EKG showing sinus rhythm and no acute changes.  Patient received DuoNeb, cefepime, and 2.2 L IV fluids.  Patient states he has history of bladder cancer and had 2 procedures done last year and since then he has continued to have recurrent urinary tract infections.  His urologist is at [facility].  States a few days ago he started having burning with urination and was concerned that he might have another UTI.  He is endorsing right-sided flank pain.  Denies urinary frequency or urgency.  Denies nausea, vomiting, or abdominal pain.  He is also reporting history of chronic dyspnea on exertion for several years.  Denies any shortness of breath at present and denies cough or chest pain.  Review of Systems:  Review of Systems   All other systems reviewed and are negative.   Past Medical History:  Diagnosis Date   COVID-19     Past Surgical History:  Procedure Laterality Date   HERNIA REPAIR       reports that he quit smoking about 9 years ago. His smoking use included cigarettes. He smoked an average of 1 pack per day. He has never used smokeless tobacco. He reports current alcohol use. He reports that he does not use drugs.  No Known Allergies  Family History  Problem Relation Age of Onset   Hypertension Mother    Lung cancer Father    Hypertension Father     Prior to Admission medications   Medication Sig Start Date End Date Taking? Authorizing Provider  albuterol (PROVENTIL HFA;VENTOLIN HFA) 108 (90 BASE) MCG/ACT inhaler Inhale 1-2 puffs into the lungs every 6 (six) hours as needed for wheezing. 03/06/13   Varney Biles, MD  aspirin EC 81 MG tablet Take 81 mg by mouth daily.    [provider]  blood glucose meter kit and supplies Dispense based on patient and insurance preference. Use up to four times daily as directed. (FOR ICD-10 E10.9, E11.9). 12/14/19   Ghimire, Henreitta Leber, MD  fluticasone (FLONASE) 50 MCG/ACT nasal spray Place 1-2 sprays into both nostrils daily as needed (congestion.). 12/01/19   [provider]  fluticasone-salmeterol (ADVAIR DISKUS) 500-50 MCG/ACT AEPB Inhale 1 puff into the lungs in the morning and at bedtime. 07/11/22   Hunsucker, Bonna Gains, MD  Fluticasone-Umeclidin-Vilant (TRELEGY ELLIPTA) 200-62.5-25 MCG/ACT AEPB Inhale 1 puff into the  lungs daily. 03/14/22   Hunsucker, Bonna Gains, MD  Fluticasone-Umeclidin-Vilant (TRELEGY ELLIPTA) 200-62.5-25 MCG/ACT AEPB Inhale 1 puff into the lungs daily. 06/13/22   Hunsucker, Bonna Gains, MD  ipratropium (ATROVENT) 0.06 % nasal spray SPRAY 2 SPRAYS INTO EACH NOSTRIL EVERY 6 HOURS AS NEEDED 01/23/21   [provider]  lisinopril (ZESTRIL) 20 MG tablet Take 20 mg by mouth daily. 06/15/19   [provider]   montelukast (SINGULAIR) 10 MG tablet Take 10 mg by mouth daily. 09/06/21   [provider]  Tiotropium Bromide Monohydrate (SPIRIVA RESPIMAT) 2.5 MCG/ACT AERS Inhale 2 puffs into the lungs daily. 07/11/22   Hunsucker, Bonna Gains, MD    Physical Exam: Vitals:   02/19/23 2201 02/20/23 0030 02/20/23 0130 02/20/23 0155  BP:  121/61 (!) 103/54   Pulse:  65 (!) 59   Resp:  (!) 25 (!) 25   Temp:    98 F (36.7 C)  TempSrc:      SpO2:  98% 91%   Weight: 124.2 kg     Height: 5\' 8"  (1.727 m)       Physical Exam Vitals reviewed.  Constitutional:      General: He is not in acute distress. HENT:     Head: Normocephalic and atraumatic.  Eyes:     Extraocular Movements: Extraocular movements intact.  Cardiovascular:     Rate and Rhythm: Normal rate and regular rhythm.     Pulses: Normal pulses.  Pulmonary:     Effort: Pulmonary effort is normal. No respiratory distress.     Breath sounds: Normal breath sounds. No wheezing or rales.  Abdominal:     General: Bowel sounds are normal. There is distension.     Palpations: Abdomen is soft.     Tenderness: There is no abdominal tenderness. There is no guarding.  Musculoskeletal:     Cervical back: Normal range of motion.     Right lower leg: No edema.     Left lower leg: No edema.  Skin:    General: Skin is warm and dry.  Neurological:     General: No focal deficit present.     Mental Status: He is alert and oriented to person, place, and time.     Labs on Admission: I have personally reviewed following labs and imaging studies  CBC: Recent Labs  Lab 02/19/23 2236  WBC 39.8*  HGB 14.4  HCT 44.1  MCV 95.9  PLT [id number]   Basic Metabolic Panel: Recent Labs  Lab 02/19/23 2236  NA 136  K 4.4  CL 103  CO2 21*  GLUCOSE 149*  BUN 32*  CREATININE 2.06*  CALCIUM 8.7*   GFR: Estimated Creatinine Clearance: 39.1 mL/min (A) (by C-G formula based on SCr of 2.06 mg/dL (H)). Liver Function Tests: Recent Labs  Lab  02/19/23 2236  AST 27  ALT 24  ALKPHOS 49  BILITOT 2.2*  PROT 6.7  ALBUMIN 3.7   Recent Labs  Lab 02/19/23 2236  LIPASE 20   No results for input(s): "AMMONIA" in the last 168 hours. Coagulation Profile: Recent Labs  Lab 02/19/23 2236  INR 1.2   Cardiac Enzymes: No results for input(s): "CKTOTAL", "CKMB", "CKMBINDEX", "TROPONINI" in the last 168 hours. BNP (last 3 results) No results for input(s): "PROBNP" in the last 8760 hours. HbA1C: No results for input(s): "HGBA1C" in the last 72 hours. CBG: Recent Labs  Lab 02/19/23 2235  GLUCAP 165*   Lipid Profile: No results for input(s): "CHOL", "HDL", "LDLCALC", "  TRIG", "CHOLHDL", "LDLDIRECT" in the last 72 hours. Thyroid Function Tests: No results for input(s): "TSH", "T4TOTAL", "FREET4", "T3FREE", "THYROIDAB" in the last 72 hours. Anemia Panel: No results for input(s): "VITAMINB12", "FOLATE", "FERRITIN", "TIBC", "IRON", "RETICCTPCT" in the last 72 hours. Urine analysis:    Component Value Date/Time   COLORURINE AMBER (A) 02/20/2023 0154   APPEARANCEUR CLOUDY (A) 02/20/2023 0154   LABSPEC 1.026 02/20/2023 0154   PHURINE 5.0 02/20/2023 0154   GLUCOSEU NEGATIVE 02/20/2023 0154   HGBUR MODERATE (A) 02/20/2023 0154   BILIRUBINUR NEGATIVE 02/20/2023 0154   KETONESUR 5 (A) 02/20/2023 0154   PROTEINUR >=300 (A) 02/20/2023 0154   NITRITE NEGATIVE 02/20/2023 0154   LEUKOCYTESUR MODERATE (A) 02/20/2023 0154    Radiological Exams on Admission: CT Renal Stone Study  Result Date: 02/20/2023 CLINICAL DATA:  Abdominal pain.  Concern for kidney stone. EXAM: CT ABDOMEN AND PELVIS WITHOUT CONTRAST TECHNIQUE: Multidetector CT imaging of the abdomen and pelvis was performed following the standard protocol without IV contrast. RADIATION DOSE REDUCTION: This exam was performed according to the departmental dose-optimization program which includes automated exposure control, adjustment of the mA and/or kV according to patient size and/or  use of iterative reconstruction technique. COMPARISON:  CT abdomen pelvis dated 07/17/2019. FINDINGS: Evaluation of this exam is limited in the absence of intravenous contrast. Lower chest: The visualized lung bases are clear. There is coronary vascular calcification. No intra-abdominal free air or free fluid. Hepatobiliary: Fatty liver. No biliary dilatation. The gallbladder is unremarkable. Pancreas: Unremarkable. No pancreatic ductal dilatation or surrounding inflammatory changes. Spleen: Normal in size without focal abnormality. Adrenals/Urinary Tract: The adrenal glands are unremarkable. There is no hydronephrosis or nephrolithiasis on either side. There is a 2 cm exophytic cyst arising from the interpolar right kidney. There is right perinephric stranding which may be related to cyst rupture or infection. Correlation with urinalysis recommended. The visualized ureters appear unremarkable. The urinary bladder is minimally distended. Small foci of air within the bladder may have been introduced by recent instrumentation. Clinical correlation is recommended. Stomach/Bowel: There is sigmoid diverticulosis and scattered colonic diverticula without active inflammatory changes. There is no bowel obstruction or active inflammation. The appendix is normal. Vascular/Lymphatic: Mild aortoiliac atherosclerotic disease. The IVC is unremarkable. No portal venous gas. There is no adenopathy. Reproductive: The prostate and seminal vesicles are grossly unremarkable. No pelvic mass. Other: Prior bilateral inguinal hernia repair. Partially visualized 1.5 x 3.5 cm ovoid low attenuating structure in the right inguinal canal is not characterized on this CT but appears similar to prior CT and may represent a chronic collection or an area of fat necrosis. Musculoskeletal: Disc desiccation at L5-S1. No acute osseous pathology. IMPRESSION: 1. No hydronephrosis or nephrolithiasis. 2. Right perinephric stranding may be related to cyst  rupture or infection. Correlation with urinalysis recommended. 3. Colonic diverticulosis. No bowel obstruction. Normal appendix. 4. Fatty liver. 5.  Aortic Atherosclerosis (ICD10-I70.0). Electronically Signed   By: Anner Crete M.D.   On: 02/20/2023 01:20   DG Chest 2 View  Result Date: 02/19/2023 CLINICAL DATA:  Dyspnea EXAM: CHEST - 2 VIEW COMPARISON:  12/08/2019 FINDINGS: The heart size and mediastinal contours are within normal limits. Both lungs are clear. The visualized skeletal structures are unremarkable. IMPRESSION: No active cardiopulmonary disease. Electronically Signed   By: Fidela Salisbury M.D.   On: 02/19/2023 23:06    Assessment and Plan  Severe sepsis secondary to complicated UTI/acute right-sided pyelonephritis Patient is endorsing dysuria and right flank pain.  He reports history  of bladder cancer and recurrent UTIs.  UA showing signs of infection and CT showing right perinephric stranding which may be related to cyst rupture or infection.  No kidney stones seen on CT.  CBC without differential showing significant leukocytosis (WBC 39.8).  Lactic acid 3.2> 3.4.  Patient received 2.2 L IV fluids in the ED.  He is not tachycardic or hypotensive.  Continue IV fluid hydration and cefepime.  Trend lactate and repeat CBC with differential.  Urine culture ordered.  Blood culture pending.  AKI In the setting of severe sepsis.  Creatinine currently 2.0 and baseline is around 1.0.  CT without signs of obstructive uropathy.  Continue IV fluid hydration and monitor renal function.  Avoid nephrotoxic agents/hold home lisinopril and diclofenac.  Chronic dyspnea on exertion He has asthma but not wheezing.  Chest x-ray showing no active cardiopulmonary disease.  PE less likely as age-adjusted D-dimer within normal range.  He is not tachycardic and maintaining sats in the 90s on room air.  ACS less likely as EKG without acute ischemic changes and he is not endorsing any chest pain.  I have ordered  echocardiogram for further evaluation.  Mildly elevated total bilirubin Likely due to severe sepsis.  Transaminases and alkaline phosphatase normal.  CT showing no biliary dilatation.  Continue to monitor LFTs.  History of bladder cancer He will need to follow-up with his urologist at [facility].  Abdominal distention No abdominal pain or tenderness.  Patient denies nausea, vomiting, or constipation.  CT without evidence of bowel obstruction.  Asthma Stable, no wheezing.  Continue Singulair and albuterol.  Breo plus Incruse Ellipta (hospital formulary replacement for Trelegy)  Hypertension Systolic currently in the low 100s, avoid antihypertensives at this time given concern for severe sepsis.  DVT prophylaxis: Lovenox Code Status: Full Code (discussed with the patient) Family Communication: No family available at this time. Level of care: Progressive Care Unit Admission status: It is my clinical opinion that admission to INPATIENT is reasonable and necessary because of the expectation that this patient will require hospital care that crosses at least 2 midnights to treat this condition based on the medical complexity of the problems presented.  Given the aforementioned information, the predictability of an adverse outcome is felt to be significant.   Shela Leff MD [facility]  If 7PM-7AM, please contact night-coverage [email or web address]  02/20/2023, 2:43 AM

## 2023-02-20 NOTE — Progress Notes (Signed)
Subjective: Patient admitted this morning, see detailed H&P by Dr. Marlowe Sax 77 year old male with history of asthma, hypertension, COVID-pneumonia in January 2021, bladder cancer, recurrent UTI came to ED with shortness of breath and burning with urination.  In the ED CT renal stone study negative for hydronephrosis or nephrolithiasis.  Showed right perinephric stranding which may be due to cyst rupture or infection.  Has a history of bladder cancer, had 2 procedures done last years since then continued to have recurrent UTIs. Admitted with severe sepsis due to complicated UTI/acute right-sided pyonephritis  Vitals:   02/20/23 0700 02/20/23 0727  BP: 110/73   Pulse: 68   Resp: (!) 25   Temp:  99.4 F (37.4 C)  SpO2: 94%       A/P Severe sepsis secondary to complicated UTI/acute right-sided pyelonephritis -Presented with dysuria, flank pain, CT renal stone study showed right perinephric stranding -WBC elevated 13.8, lactic acid 3.2 -Started on IV cefepime  Acute kidney injury -Likely in setting of severe sepsis -Came with creatinine of 2.0, baseline 1.0 -CT abdomen without signs of obstructive uropathy  Dyspnea on exertion, chronic -Has history of asthma but no wheezing -Chest x-ray clear, age adjusted D-dimer within normal range -Echocardiogram ordered -EKG unremarkable  Mild elevation of bilirubin -Likely in setting of severe sepsis -CT showed no blue dilatation  History of bladder cancer -Follows urology at [facility]  Abdominal distention -CT without evidence of bowel obstruction  Asthma -Stable -Continue Singulair, albuterol -Continue Trelegy  Hypertension -Blood pressure low, antihypertensive medications on hold       Lamar [facility].

## 2023-02-21 ENCOUNTER — Inpatient Hospital Stay (HOSPITAL_COMMUNITY): Payer: PPO

## 2023-02-21 DIAGNOSIS — R6520 Severe sepsis without septic shock: Secondary | ICD-10-CM

## 2023-02-21 DIAGNOSIS — J4530 Mild persistent asthma, uncomplicated: Secondary | ICD-10-CM | POA: Diagnosis not present

## 2023-02-21 DIAGNOSIS — R0602 Shortness of breath: Secondary | ICD-10-CM | POA: Diagnosis not present

## 2023-02-21 DIAGNOSIS — N12 Tubulo-interstitial nephritis, not specified as acute or chronic: Secondary | ICD-10-CM | POA: Diagnosis not present

## 2023-02-21 DIAGNOSIS — N179 Acute kidney failure, unspecified: Secondary | ICD-10-CM | POA: Diagnosis not present

## 2023-02-21 DIAGNOSIS — A419 Sepsis, unspecified organism: Secondary | ICD-10-CM

## 2023-02-21 DIAGNOSIS — N10 Acute tubulo-interstitial nephritis: Secondary | ICD-10-CM | POA: Diagnosis not present

## 2023-02-21 DIAGNOSIS — Z8551 Personal history of malignant neoplasm of bladder: Secondary | ICD-10-CM

## 2023-02-21 LAB — COMPREHENSIVE METABOLIC PANEL
ALT: 21 U/L (ref 0–44)
AST: 19 U/L (ref 15–41)
Albumin: 3 g/dL — ABNORMAL LOW (ref 3.5–5.0)
Alkaline Phosphatase: 57 U/L (ref 38–126)
Anion gap: 6 (ref 5–15)
BUN: 25 mg/dL — ABNORMAL HIGH (ref 8–23)
CO2: 25 mmol/L (ref 22–32)
Calcium: 8.1 mg/dL — ABNORMAL LOW (ref 8.9–10.3)
Chloride: 103 mmol/L (ref 98–111)
Creatinine, Ser: 1.17 mg/dL (ref 0.61–1.24)
GFR, Estimated: >60 mL/min (ref >60)
Glucose, Bld: 128 mg/dL — ABNORMAL HIGH (ref 70–99)
Potassium: 3.6 mmol/L (ref 3.5–5.1)
Sodium: 134 mmol/L — ABNORMAL LOW (ref 135–145)
Total Bilirubin: 1.1 mg/dL (ref 0.3–1.2)
Total Protein: 6.1 g/dL — ABNORMAL LOW (ref 6.5–8.1)

## 2023-02-21 LAB — ECHOCARDIOGRAM COMPLETE
Area-P 1/2: 3.85 cm2
Calc EF: 66.7 %
Height: 68 in
S' Lateral: 3.8 cm
Single Plane A2C EF: 67.6 %
Single Plane A4C EF: 65.2 %
Weight: 4380.8 oz

## 2023-02-21 LAB — CBC
HCT: 35.5 % — ABNORMAL LOW (ref 39.0–52.0)
Hemoglobin: 11.6 g/dL — ABNORMAL LOW (ref 13.0–17.0)
MCH: 31.5 pg (ref 26.0–34.0)
MCHC: 32.7 g/dL (ref 30.0–36.0)
MCV: 96.5 fL (ref 80.0–100.0)
Platelets: 152 10*3/uL (ref 150–400)
RBC: 3.68 MIL/uL — ABNORMAL LOW (ref 4.22–5.81)
RDW: 17.1 % — ABNORMAL HIGH (ref 11.5–15.5)
WBC: 21.6 10*3/uL — ABNORMAL HIGH (ref 4.0–10.5)
nRBC: 0 % (ref 0.0–0.2)

## 2023-02-21 MED ORDER — IPRATROPIUM-ALBUTEROL 0.5-2.5 (3) MG/3ML IN SOLN
3.0000 mL | Freq: Three times a day (TID) | RESPIRATORY_TRACT | Status: DC
  Administered 2023-02-22 (×2): 3 mL via RESPIRATORY_TRACT
  Filled 2023-02-21 (×2): qty 3

## 2023-02-21 MED ORDER — IPRATROPIUM-ALBUTEROL 0.5-2.5 (3) MG/3ML IN SOLN
3.0000 mL | Freq: Four times a day (QID) | RESPIRATORY_TRACT | Status: DC
  Administered 2023-02-21 (×4): 3 mL via RESPIRATORY_TRACT
  Filled 2023-02-21 (×4): qty 3

## 2023-02-21 MED ORDER — SODIUM CHLORIDE 0.9 % IV SOLN
2.0000 g | Freq: Three times a day (TID) | INTRAVENOUS | Status: DC
  Administered 2023-02-21 – 2023-02-22 (×3): 2 g via INTRAVENOUS
  Filled 2023-02-21 (×3): qty 12.5

## 2023-02-21 MED ORDER — PERFLUTREN LIPID MICROSPHERE
1.0000 mL | INTRAVENOUS | Status: AC | PRN
  Administered 2023-02-21: 3 mL via INTRAVENOUS

## 2023-02-21 NOTE — Progress Notes (Signed)
Echocardiogram 2D Echocardiogram has been performed.  Frances Furbish 02/21/2023, 8:56 AM

## 2023-02-21 NOTE — Progress Notes (Signed)
Pharmacy Antibiotic Note  Manuel Castillo. is a 77 y.o. male admitted on 02/19/2023 with  UTI/pyelonephritis .  Pharmacy has been consulted for cefepime dosing.  Today, 02/21/23 -WBC remains elevated but is trending down -AKI resolved. CrCl now > 60 mL/min  Plan: Increase cefepime to 2 g IV q8h Continue to monitor renal function and culture data  Height: 5\' 8"  (172.7 cm) Weight: 124.2 kg (273 lb 12.8 oz) IBW/kg (Calculated) : 68.4  Temp (24hrs), Avg:98.7 F (37.1 C), Min:98.4 F (36.9 C), Max:99.3 F (37.4 C)  Recent Labs  Lab 02/19/23 2236 02/20/23 0143 02/20/23 0424 02/20/23 0723 02/21/23 0359  WBC 39.8*  --   --  32.9* 21.6*  CREATININE 2.06*  --   --  1.61* 1.17  LATICACIDVEN 3.2* 3.4* 3.2* 2.5*  --     Estimated Creatinine Clearance: 68.9 mL/min (by C-G formula based on SCr of 1.17 mg/dL).    No Known Allergies  Antimicrobials this admission: cefepime 4/2 >>   Dose adjustments this admission: 4/4: Cefepime 2 g IV q12h > 2 g IV q8h  Microbiology results: 4/3 BCx: ngtd 4/3 UCx: 20K PsA. Susceptibilities pending   Lenis Noon, PharmD 02/21/2023 10:22 AM

## 2023-02-21 NOTE — Consult Note (Signed)
Manuel Castillo for Infectious Disease    Date of Admission:  02/19/2023     Reason for Consult: Urinary tract infection     Referring Physician: Dr. Darrick Meigs  Current antibiotics: Cefepime    ASSESSMENT:    77 y.o. male admitted with:  Pseudomonas aeruginosa UTI, possible pyelonephritis History of recurrent UTI Bladder cancer s/p TURBT and Gemcitabine/Docetaxel x 6 rounds Acute kidney injury Severe sepsis due to #1   RECOMMENDATIONS:    He is clinically improving on cefepime and will continue pending urine culture sensitivities At this time, would not recommend prophylactic antibiotics for UTI prevention given its uncertain benefit and PsA likely to quickly acquire resistance Unclear how much of his frequent urinary symptoms could be related to chemotherapy bladder instillations vs UTI He appears at least colonized with Pseudomonas.  If after treatment, he develops recurrent UTI again with PsA would consider chronic prostatitis and possible treatment Will follow   Principal Problem:   Acute pyelonephritis Active Problems:   Essential hypertension   Dyspnea on exertion   Asthma, chronic   Severe sepsis   AKI (acute kidney injury)   Serum total bilirubin elevated   History of bladder cancer   MEDICATIONS:    Scheduled Meds:  enoxaparin (LOVENOX) injection  60 mg Subcutaneous Q24H   fluticasone furoate-vilanterol  1 puff Inhalation Daily   And   umeclidinium bromide  1 puff Inhalation Daily   ipratropium-albuterol  3 mL Nebulization Q6H   montelukast  10 mg Oral Daily   Continuous Infusions:  ceFEPime (MAXIPIME) IV 2 g (02/21/23 1104)   PRN Meds:.acetaminophen **OR** acetaminophen, albuterol, hydrALAZINE  HPI:    Manuel Castillo. is a 77 y.o. male with a past medical history of bladder cancer followed at [facility] currently undergoing treatment and recurrent urinary tract infections who presented to the emergency department 02/19/2023 with complaints  of shortness of breath and burning with urination.  He was found to have a leukocytosis of 39.8 and was afebrile.  He had blood cultures obtained from 1 set which are currently no growth.  He also had a urinalysis obtained that showed pyuria and many bacteria.  His urine culture is growing 20,000 colonies of Pseudomonas aeruginosa.  He has been on cefepime since admission with improvement in his leukocytosis down to 21.6 today.  His urinary symptoms are also improved.  He had a CT renal stone study on admission that showed some perinephric stranding possibly related to cyst rupture or pyelonephritis.    With his history of bladder cancer, he had transurethral resection of bladder tumor in September and November 2023 as well as January 2024.  He reports since then has had issues with recurrent urinary tract infections and has been "off and on" antibiotics.  He had a Foley after his most recent TURBT that was removed on January 17.  He most recently underwent his sixth of 6 gemcitabine/docetaxel treatment for his bladder cancer.  Per the notes, his urine was checked on arrival that day (02/06/2023) and was clear of infection.  In review of his prior cultures, he has consistently grown Pseudomonas aeruginosa from urine cultures.  We were asked to see patient regarding antibiotic recommendations.    Past Medical History:  Diagnosis Date   COVID-19     Social History   Tobacco Use   Smoking status: Former    Packs/day: 1    Types: Cigarettes    Quit date: 03/03/2013    Years since quitting:  9.9   Smokeless tobacco: Never   Tobacco comments:    quit 8 years ago  Vaping Use   Vaping Use: Never used  Substance Use Topics   Alcohol use: Yes   Drug use: No    Family History  Problem Relation Age of Onset   Hypertension Mother    Lung cancer Father    Hypertension Father     No Known Allergies  Review of Systems  All other systems reviewed and are negative. Except as noted  above.  OBJECTIVE:   Blood pressure 127/68, pulse (!) 59, temperature 98.5 F (36.9 C), temperature source Oral, resp. rate 20, height 5\' 8"  (1.727 m), weight 124.2 kg, SpO2 96 %. Body mass index is 41.63 kg/m.  Physical Exam Constitutional:      General: He is not in acute distress.    Appearance: Normal appearance.  HENT:     Head: Normocephalic and atraumatic.  Eyes:     Extraocular Movements: Extraocular movements intact.     Conjunctiva/sclera: Conjunctivae normal.  Cardiovascular:     Pulses: Normal pulses.  Pulmonary:     Effort: Pulmonary effort is normal. No respiratory distress.  Abdominal:     General: There is no distension.     Palpations: Abdomen is soft.  Musculoskeletal:        General: Normal range of motion.     Cervical back: Normal range of motion and neck supple.  Skin:    General: Skin is warm and dry.  Neurological:     General: No focal deficit present.     Mental Status: He is alert and oriented to person, place, and time.  Psychiatric:        Mood and Affect: Mood normal.        Behavior: Behavior normal.      Lab Results: Lab Results  Component Value Date   WBC 21.6 (H) 02/21/2023   HGB 11.6 (L) 02/21/2023   HCT 35.5 (L) 02/21/2023   MCV 96.5 02/21/2023   PLT 152 02/21/2023    Lab Results  Component Value Date   NA 134 (L) 02/21/2023   K 3.6 02/21/2023   CO2 25 02/21/2023   GLUCOSE 128 (H) 02/21/2023   BUN 25 (H) 02/21/2023   CREATININE 1.17 02/21/2023   CALCIUM 8.1 (L) 02/21/2023   GFRNONAA >60 02/21/2023   GFRAA >60 12/14/2019    Lab Results  Component Value Date   ALT 21 02/21/2023   AST 19 02/21/2023   ALKPHOS 57 02/21/2023   BILITOT 1.1 02/21/2023       Component Value Date/Time   CRP 0.6 12/14/2019 0310    No results found for: "ESRSEDRATE"  I have reviewed the micro and lab results in Epic.  Imaging: ECHOCARDIOGRAM COMPLETE  Result Date: 02/21/2023    ECHOCARDIOGRAM REPORT   Patient Name:   Manuel Castillo. Date of Exam: 02/21/2023 Medical Rec #:  [id number]        Height:       68.0 in Accession #:    [id number]       Weight:       273.8 lb Date of Birth:  Mar 20, 1946         BSA:          2.336 m Patient Age:    55 years         BP:           127/68 mmHg Patient Gender: M  HR:           62 bpm. Exam Location:  Inpatient Procedure: 2D Echo, Color Doppler, Cardiac Doppler and Intracardiac            Opacification Agent Indications:    Dyspnea  History:        Patient has no prior history of Echocardiogram examinations.  Sonographer:    Phineas Douglas Referring Phys: [id number] VASUNDHRA RATHORE IMPRESSIONS  1. Left ventricular ejection fraction, by estimation, is 60 to 65%. The left ventricle has normal function. The left ventricle has no regional wall motion abnormalities. Left ventricular diastolic parameters are consistent with Grade I diastolic dysfunction (impaired relaxation).  2. Right ventricular systolic function is normal. The right ventricular size is normal. There is normal pulmonary artery systolic pressure.  3. Left atrial size was moderately dilated.  4. The mitral valve is normal in structure. Trivial mitral valve regurgitation. No evidence of mitral stenosis.  5. The aortic valve is normal in structure. Aortic valve regurgitation is not visualized. No aortic stenosis is present.  6. The inferior vena cava is dilated in size with <50% respiratory variability, suggesting right atrial pressure of 15 mmHg. FINDINGS  Left Ventricle: Left ventricular ejection fraction, by estimation, is 60 to 65%. The left ventricle has normal function. The left ventricle has no regional wall motion abnormalities. Definity contrast agent was given IV to delineate the left ventricular  endocardial borders. The left ventricular internal cavity size was normal in size. There is no left ventricular hypertrophy. Left ventricular diastolic parameters are consistent with Grade I diastolic dysfunction (impaired relaxation).  Indeterminate filling pressures. Right Ventricle: The right ventricular size is normal. No increase in right ventricular wall thickness. Right ventricular systolic function is normal. There is normal pulmonary artery systolic pressure. The tricuspid regurgitant velocity is 1.89 m/s, and  with an assumed right atrial pressure of 15 mmHg, the estimated right ventricular systolic pressure is [id number] mmHg. Left Atrium: Left atrial size was moderately dilated. Right Atrium: Right atrial size was normal in size. Pericardium: There is no evidence of pericardial effusion. Mitral Valve: The mitral valve is normal in structure. Trivial mitral valve regurgitation. No evidence of mitral valve stenosis. Tricuspid Valve: The tricuspid valve is normal in structure. Tricuspid valve regurgitation is trivial. No evidence of tricuspid stenosis. Aortic Valve: The aortic valve is normal in structure. Aortic valve regurgitation is not visualized. No aortic stenosis is present. Pulmonic Valve: The pulmonic valve was normal in structure. Pulmonic valve regurgitation is trivial. No evidence of pulmonic stenosis. Aorta: The aortic root is normal in size and structure. Venous: The inferior vena cava is dilated in size with less than 50% respiratory variability, suggesting right atrial pressure of 15 mmHg. IAS/Shunts: No atrial level shunt detected by color flow Doppler.  LEFT VENTRICLE PLAX 2D LVIDd:         5.40 cm      Diastology LVIDs:         3.80 cm      LV e' medial:    8.38 cm/s LV PW:         1.00 cm      LV E/e' medial:  11.8 LV IVS:        1.00 cm      LV e' lateral:   8.38 cm/s LVOT diam:     2.00 cm      LV E/e' lateral: 11.8 LV SV:         90 LV SV Index:  39 LVOT Area:     3.14 cm  LV Volumes (MOD) LV vol d, MOD A2C: 172.0 ml LV vol d, MOD A4C: 151.0 ml LV vol s, MOD A2C: 55.7 ml LV vol s, MOD A4C: 52.5 ml LV SV MOD A2C:     116.3 ml LV SV MOD A4C:     151.0 ml LV SV MOD BP:      110.1 ml RIGHT VENTRICLE             IVC RV Basal  diam:  3.90 cm     IVC diam: 2.70 cm RV S prime:     16.60 cm/s TAPSE (M-mode): 1.9 cm LEFT ATRIUM             Index        RIGHT ATRIUM           Index LA diam:        4.30 cm 1.84 cm/m   RA Area:     15.70 cm LA Vol (A2C):   87.1 ml 37.29 ml/m  RA Volume:   33.20 ml  14.21 ml/m LA Vol (A4C):   80.2 ml 34.34 ml/m LA Biplane Vol: 90.1 ml 38.58 ml/m  AORTIC VALVE LVOT Vmax:   124.00 cm/s LVOT Vmean:  94.500 cm/s LVOT VTI:    0.288 m  AORTA Ao Root diam: 3.50 cm Ao Asc diam:  3.40 cm MITRAL VALVE                TRICUSPID VALVE MV Area (PHT): 3.85 cm     TR Peak grad:   14.3 mmHg MV Decel Time: 197 msec     TR Vmax:        189.00 cm/s MV E velocity: 99.00 cm/s MV A velocity: 100.00 cm/s  SHUNTS MV E/A ratio:  0.99         Systemic VTI:  0.29 m                             Systemic Diam: 2.00 cm Skeet Latch MD Electronically signed by Skeet Latch MD Signature Date/Time: 02/21/2023/11:17:56 AM    Final    CT Renal Stone Study  Result Date: 02/20/2023 CLINICAL DATA:  Abdominal pain.  Concern for kidney stone. EXAM: CT ABDOMEN AND PELVIS WITHOUT CONTRAST TECHNIQUE: Multidetector CT imaging of the abdomen and pelvis was performed following the standard protocol without IV contrast. RADIATION DOSE REDUCTION: This exam was performed according to the departmental dose-optimization program which includes automated exposure control, adjustment of the mA and/or kV according to patient size and/or use of iterative reconstruction technique. COMPARISON:  CT abdomen pelvis dated 07/17/2019. FINDINGS: Evaluation of this exam is limited in the absence of intravenous contrast. Lower chest: The visualized lung bases are clear. There is coronary vascular calcification. No intra-abdominal free air or free fluid. Hepatobiliary: Fatty liver. No biliary dilatation. The gallbladder is unremarkable. Pancreas: Unremarkable. No pancreatic ductal dilatation or surrounding inflammatory changes. Spleen: Normal in size without focal  abnormality. Adrenals/Urinary Tract: The adrenal glands are unremarkable. There is no hydronephrosis or nephrolithiasis on either side. There is a 2 cm exophytic cyst arising from the interpolar right kidney. There is right perinephric stranding which may be related to cyst rupture or infection. Correlation with urinalysis recommended. The visualized ureters appear unremarkable. The urinary bladder is minimally distended. Small foci of air within the bladder may have been introduced by recent instrumentation. Clinical correlation is recommended.  Stomach/Bowel: There is sigmoid diverticulosis and scattered colonic diverticula without active inflammatory changes. There is no bowel obstruction or active inflammation. The appendix is normal. Vascular/Lymphatic: Mild aortoiliac atherosclerotic disease. The IVC is unremarkable. No portal venous gas. There is no adenopathy. Reproductive: The prostate and seminal vesicles are grossly unremarkable. No pelvic mass. Other: Prior bilateral inguinal hernia repair. Partially visualized 1.5 x 3.5 cm ovoid low attenuating structure in the right inguinal canal is not characterized on this CT but appears similar to prior CT and may represent a chronic collection or an area of fat necrosis. Musculoskeletal: Disc desiccation at L5-S1. No acute osseous pathology. IMPRESSION: 1. No hydronephrosis or nephrolithiasis. 2. Right perinephric stranding may be related to cyst rupture or infection. Correlation with urinalysis recommended. 3. Colonic diverticulosis. No bowel obstruction. Normal appendix. 4. Fatty liver. 5.  Aortic Atherosclerosis (ICD10-I70.0). Electronically Signed   By: Anner Crete M.D.   On: 02/20/2023 01:20   DG Chest 2 View  Result Date: 02/19/2023 CLINICAL DATA:  Dyspnea EXAM: CHEST - 2 VIEW COMPARISON:  12/08/2019 FINDINGS: The heart size and mediastinal contours are within normal limits. Both lungs are clear. The visualized skeletal structures are unremarkable.  IMPRESSION: No active cardiopulmonary disease. Electronically Signed   By: Fidela Salisbury M.D.   On: 02/19/2023 23:06     Imaging independently reviewed in Epic.  Raynelle Highland for Infectious Disease [facility] [phone] pager 02/21/2023, 1:05 PM

## 2023-02-21 NOTE — Progress Notes (Signed)
[facility]  PROGRESS NOTE  [name]. [id number] DOB: 27-Oct-1946 DOA: 02/19/2023 PCP: [facility]   Brief HPI:   77 year old male with history of asthma, hypertension, COVID-pneumonia in January 2021, bladder cancer, recurrent UTI came to ED with shortness of breath and burning with urination.  In the ED CT renal stone study negative for hydronephrosis or nephrolithiasis.  Showed right perinephric stranding which may be due to cyst rupture or infection.  Has a history of bladder cancer, had 2 procedures done last years since then continued to have recurrent UTIs. Admitted with severe sepsis due to complicated UTI/acute right-sided pyonephritis      Subjective   Patient seen and examined, feels better this morning.  White count is down to 21,000.   Assessment/Plan:     Severe sepsis secondary to complicated UTI/acute right-sided pyelonephritis -Presented with dysuria, flank pain, CT renal stone study showed right perinephric stranding -WBC down to 21.6, lactic acid 3.2>2.5 -Continue IV cefepime -Urine culture growing 20,000 colonies of Pseudomonas -Patient has been having recurrent UTIs with Pseudomonas growing in urine culture since January -Called and discussed with infectious disease, they will see patient in consult for further recommendations as patient having repeated episodes of Pseudomonas UTI   Acute kidney injury -Likely in setting of severe sepsis -Came with creatinine improved to 1.17,  baseline 1.0 -CT abdomen without signs of obstructive uropathy   Dyspnea on exertion, chronic -Has history of asthma but no wheezing -Chest x-ray clear, age adjusted D-dimer within normal range -Echocardiogram showed EF 60 to [id number], grade 1 diastolic dysfunction -EKG unremarkable   Mild elevation of bilirubin -Likely in setting of severe sepsis -CT showed no biliary  dilatation   History of bladder cancer -[facility]s urology at [facility]   Abdominal distention -CT without evidence of bowel obstruction   Asthma -Stable -Continue Singulair, albuterol -Continue Trelegy   Hypertension -Blood pressure was low on admission ; blood pressure has improved -antihypertensive medications on hold -Continue to monitor, restart medications once blood pressure starts to go up    Medications     enoxaparin (LOVENOX) injection  60 mg Subcutaneous Q24H   fluticasone furoate-vilanterol  1 puff Inhalation Daily   And   umeclidinium bromide  1 puff Inhalation Daily   ipratropium-albuterol  3 mL Nebulization Q6H   montelukast  10 mg Oral Daily     Data Reviewed:   CBG:  Recent Labs  Lab 02/19/23 2235  GLUCAP 165*    SpO2: 96 % O2 Flow Rate (L/min): 2 L/min    Vitals:   02/20/23 1937 02/20/23 2327 02/21/23 0452 02/21/23 0916  BP: (!) 124/55 136/60 127/68   Pulse: 66 63 (!) 59   Resp: 20 20 20    Temp: 98.4 F (36.9 C) 99.3 F (37.4 C) 98.5 F (36.9 C)   TempSrc: Oral Oral Oral   SpO2: 100% 97% 97% 96%  Weight:      Height:          Data Reviewed:  Basic Metabolic Panel: Recent Labs  Lab 02/19/23 2236 02/20/23 0723 02/21/23 0359  NA 136 134* 134*  K 4.4 4.1 3.6  CL 103 103 103  CO2 21* 21* 25  GLUCOSE 149* 152* 128*  BUN 32* 34* 25*  CREATININE 2.06* 1.61* 1.17  CALCIUM 8.7* 8.2* 8.1*    CBC: Recent Labs  Lab 02/19/23 2236 02/20/23 0723 02/21/23 0359  WBC 39.8* 32.9* 21.6*  NEUTROABS  --  27.7*  --  HGB 14.4 12.8* 11.6*  HCT 44.1 38.8* 35.5*  MCV 95.9 95.1 96.5  PLT 224 175 152    LFT Recent Labs  Lab 02/19/23 2236 02/20/23 0723 02/21/23 0359  AST 27 22 19   ALT 24 21 21   ALKPHOS 49 47 57  BILITOT 2.2* 2.1* 1.1  PROT 6.7 6.3* 6.1*  ALBUMIN 3.7 3.3* 3.0*     Antibiotics: Anti-infectives (From admission, onward)    Start     Dose/Rate Route Frequency Ordered Stop   02/20/23 1200  ceFEPIme (MAXIPIME) 2 g in sodium chloride 0.9 % 100 mL IVPB        2 g 200 mL/hr  over 30 Minutes Intravenous Every 12 hours 02/20/23 0228     02/19/23 2330  ceFEPIme (MAXIPIME) 2 g in sodium chloride 0.9 % 100 mL IVPB        2 g 200 mL/hr over 30 Minutes Intravenous  Once 02/19/23 2320 02/20/23 0147        DVT prophylaxis: Lovenox  Code Status: Full code  Family Communication: Discussed with patient's wife and daughter at bedside   CONSULTS    Objective    Physical Examination:   General-appears in no acute distress Heart-S1-S2, regular, no murmur auscultated Lungs-clear to auscultation bilaterally, no wheezing or crackles auscultated Abdomen-soft, nontender, no organomegaly Extremities-no edema in the lower extremities Neuro-alert, oriented x3, no focal deficit noted  Status is: Inpatient:             Oswald Hillock   [facility] If 7PM-7AM, please contact night-coverage at [email or web address], Office  [phone]   02/21/2023, 10:09 AM  LOS: 1 day

## 2023-02-22 DIAGNOSIS — N179 Acute kidney failure, unspecified: Secondary | ICD-10-CM | POA: Diagnosis not present

## 2023-02-22 DIAGNOSIS — I10 Essential (primary) hypertension: Secondary | ICD-10-CM

## 2023-02-22 DIAGNOSIS — J4530 Mild persistent asthma, uncomplicated: Secondary | ICD-10-CM | POA: Diagnosis not present

## 2023-02-22 DIAGNOSIS — N12 Tubulo-interstitial nephritis, not specified as acute or chronic: Secondary | ICD-10-CM | POA: Diagnosis not present

## 2023-02-22 DIAGNOSIS — R6520 Severe sepsis without septic shock: Secondary | ICD-10-CM | POA: Diagnosis not present

## 2023-02-22 DIAGNOSIS — A419 Sepsis, unspecified organism: Secondary | ICD-10-CM | POA: Diagnosis not present

## 2023-02-22 LAB — URINE CULTURE: Culture: 20000 — AB

## 2023-02-22 LAB — COMPREHENSIVE METABOLIC PANEL
ALT: 22 U/L (ref 0–44)
AST: 18 U/L (ref 15–41)
Albumin: 3.1 g/dL — ABNORMAL LOW (ref 3.5–5.0)
Alkaline Phosphatase: 71 U/L (ref 38–126)
Anion gap: 7 (ref 5–15)
BUN: 17 mg/dL (ref 8–23)
CO2: 28 mmol/L (ref 22–32)
Calcium: 8.4 mg/dL — ABNORMAL LOW (ref 8.9–10.3)
Chloride: 101 mmol/L (ref 98–111)
Creatinine, Ser: 1 mg/dL (ref 0.61–1.24)
GFR, Estimated: >60 mL/min (ref >60)
Glucose, Bld: 138 mg/dL — ABNORMAL HIGH (ref 70–99)
Potassium: 4.2 mmol/L (ref 3.5–5.1)
Sodium: 136 mmol/L (ref 135–145)
Total Bilirubin: 1.3 mg/dL — ABNORMAL HIGH (ref 0.3–1.2)
Total Protein: 6.3 g/dL — ABNORMAL LOW (ref 6.5–8.1)

## 2023-02-22 LAB — CBC
HCT: 36.8 % — ABNORMAL LOW (ref 39.0–52.0)
Hemoglobin: 12 g/dL — ABNORMAL LOW (ref 13.0–17.0)
MCH: 31.4 pg (ref 26.0–34.0)
MCHC: 32.6 g/dL (ref 30.0–36.0)
MCV: 96.3 fL (ref 80.0–100.0)
Platelets: 151 10*3/uL (ref 150–400)
RBC: 3.82 MIL/uL — ABNORMAL LOW (ref 4.22–5.81)
RDW: 16.5 % — ABNORMAL HIGH (ref 11.5–15.5)
WBC: 14.9 10*3/uL — ABNORMAL HIGH (ref 4.0–10.5)
nRBC: 0 % (ref 0.0–0.2)

## 2023-02-22 MED ORDER — LEVOFLOXACIN 750 MG PO TABS: 750.0000 mg | ORAL_TABLET | Freq: Every day | ORAL | 0 refills | Status: AC

## 2023-02-22 MED ORDER — LEVOFLOXACIN 750 MG PO TABS
750.0000 mg | ORAL_TABLET | Freq: Every day | ORAL | Status: DC
  Administered 2023-02-22: 750 mg via ORAL
  Filled 2023-02-22: qty 1

## 2023-02-22 NOTE — Plan of Care (Signed)
  Problem: Education: Goal: Knowledge of General Education information will improve Description: Including pain rating scale, medication(s)/side effects and non-pharmacologic comfort measures Outcome: Completed/Met   Problem: Health Behavior/Discharge Planning: Goal: Ability to manage health-related needs will improve Outcome: Completed/Met   Problem: Clinical Measurements: Goal: Ability to maintain clinical measurements within normal limits will improve Outcome: Completed/Met Goal: Will remain free from infection Outcome: Completed/Met Goal: Diagnostic test results will improve Outcome: Completed/Met Goal: Respiratory complications will improve Outcome: Completed/Met Goal: Cardiovascular complication will be avoided Outcome: Completed/Met   Problem: Activity: Goal: Risk for activity intolerance will decrease Outcome: Completed/Met   Problem: Nutrition: Goal: Adequate nutrition will be maintained Outcome: Completed/Met   Problem: Coping: Goal: Level of anxiety will decrease Outcome: Completed/Met   Problem: Elimination: Goal: Will not experience complications related to bowel motility Outcome: Completed/Met Goal: Will not experience complications related to urinary retention Outcome: Completed/Met   Problem: Skin Integrity: Goal: Risk for impaired skin integrity will decrease Outcome: Completed/Met   Problem: Safety: Goal: Ability to remain free from injury will improve Outcome: Completed/Met   Problem: Pain Managment: Goal: General experience of comfort will improve Outcome: Completed/Met

## 2023-02-22 NOTE — Progress Notes (Signed)
Patient received discharge orders to go home. Patient received discharge paperwork/instructions. RN went over discharge instructions/paperwork with patient and patient's spouse. All questions and concerns were addressed prior to patient being discharged home. Patient left the hospital stable, had discharge instructions, and had all personal belongings.

## 2023-02-22 NOTE — Progress Notes (Deleted)
SATURATION QUALIFICATIONS: (This note is used to comply with regulatory documentation for home oxygen)  Patient Saturations on Room Air at Rest = 97%  Patient Saturations on Room Air while Ambulating = 91%  Patient Saturations on 0 Liters of oxygen while Ambulating = 91%  Will notify MD of results. Indicated as an order prior to discharge to see if patient qualifies for oxygen.

## 2023-02-22 NOTE — Progress Notes (Signed)
Mobility Specialist - Progress Note   02/22/23 1016  Mobility  Activity Ambulated independently in hallway  Level of Assistance Independent  Assistive Device None  Distance Ambulated (ft) 200 ft  Activity Response Tolerated well  Mobility Referral Yes  $Mobility charge 1 Mobility   Pt received in bed and agreeable to mobility. When returning to room pt c/o feeling SOB. O2 checked & at 88%. Once in room suggested pt put on [place] w/ pursed lip breaths bringing O2 back up to 94%. No other complaints during session. Pt to bed after session with all needs met.    During mobility: 80 HR, 88% SpO2 (RA) Post-mobility: 66 HR, 94% SPO2 (2L [place])  Chief Technology Officer

## 2023-02-22 NOTE — Plan of Care (Signed)

## 2023-02-22 NOTE — Discharge Summary (Signed)
Physician Discharge Summary   Patient: Manuel Castillo. MRN: [id number] DOB: [birth year]  Admit date:     02/19/2023  Discharge date: 02/22/23  Discharge Physician: Meredeth Ide   PCP: [facility]   Recommendations at discharge:   Follow-up urology as outpatient  Discharge Diagnoses: Principal Problem:   Pyelonephritis Active Problems:   Essential hypertension   Dyspnea on exertion   Asthma, chronic   Severe sepsis   AKI (acute kidney injury)   Serum total bilirubin elevated   History of bladder cancer   Sepsis with acute renal failure without septic shock  Resolved Problems:   * No resolved hospital problems. *  Hospital Course:  76 year old male with history of asthma, hypertension, COVID-pneumonia in January 2021, bladder cancer, recurrent UTI came to ED with shortness of breath and burning with urination.  In the ED CT renal stone study negative for hydronephrosis or nephrolithiasis.  Showed right perinephric stranding which may be due to cyst rupture or infection.  Has a history of bladder cancer, had 2 procedures done last years since then continued to have recurrent UTIs. Admitted with severe sepsis due to complicated UTI/acute right-sided pyonephritis  Assessment and Plan:  Severe sepsis secondary to complicated UTI/acute right-sided pyelonephritis -Presented with dysuria, flank pain, CT renal stone study showed right perinephric stranding -WBC down to 14.9, lactic acid 3.2>2.5 -Patient was started on IV cefepime -Urine culture growing 20,000 colonies of Pseudomonas, sensitive to Levaquin -Patient has been having recurrent UTIs with Pseudomonas growing in urine culture since January -Called and discussed with infectious disease, they will see patient in consult for further recommendations as patient having repeated episodes of Pseudomonas UTI -ID saw patient and recommended Levaquin 750 mg p.o. daily for 10 more days   Acute kidney  injury -Likely in setting of severe sepsis -Called, creatinine is back to baseline at 1.0 -Came with creatinine improved to 1.17,  baseline 1.0 -CT abdomen without signs of obstructive uropathy -Likely in setting of sepsis, dehydration, hypotension -Will discontinue diclofenac and lisinopril   Dyspnea on exertion, chronic -Has history of asthma but no wheezing -Chest x-ray clear, age adjusted D-dimer within normal range -Echocardiogram showed EF 60 to 65%, grade 1 diastolic dysfunction -EKG unremarkable   Mild elevation of bilirubin -Likely in setting of severe sepsis -CT showed no biliary  dilatation   History of bladder cancer -[facility]rology at [facility]   Abdominal distention -CT without evidence of bowel obstruction   Asthma -Stable -Continue Singulair, albuterol -Continue Trelegy   Hypertension -Blood pressure was low on admission ; blood pressure has improved -antihypertensive medications on hold -Will continue lisinopril due to acute kidney injury, blood pressure has been stable. -Will restart propranolol and discontinue lisinopril       Consultants: Infectious disease Procedures performed: None Disposition: Home Diet recommendation:  Discharge Diet Orders (From admission, onward)     Start     Ordered   02/22/23 0000  Diet - low sodium heart healthy        02/22/23 1152           Regular diet DISCHARGE MEDICATION: Allergies as of 02/22/2023   No Known Allergies      Medication List     STOP taking these medications    diclofenac 75 MG EC tablet Commonly known as: VOLTAREN   fluticasone-salmeterol 500-50 MCG/ACT Aepb Commonly known as: Advair Diskus   lisinopril 20 MG tablet Commonly known as: ZESTRIL   Spiriva Respimat 2.5  MCG/ACT Aers Generic drug: Tiotropium Bromide Monohydrate       TAKE these medications    albuterol 108 (90 Base) MCG/ACT inhaler Commonly known as: VENTOLIN HFA Inhale 1-2 puffs into the lungs every 6  (six) hours as needed for wheezing.   blood glucose meter kit and supplies Dispense based on patient and insurance preference. Use up to four times daily as directed. (FOR ICD-10 E10.9, E11.9).   fluticasone 50 MCG/ACT nasal spray Commonly known as: FLONASE Place 1-2 sprays into both nostrils daily as needed (congestion.).   levofloxacin 750 MG tablet Commonly known as: Levaquin Take 1 tablet (750 mg total) by mouth daily for 10 days.   montelukast 10 MG tablet Commonly known as: SINGULAIR Take 10 mg by mouth daily.   oxybutynin 5 MG 24 hr tablet Commonly known as: DITROPAN-XL Take 5 mg by mouth daily as needed.   propranolol ER 60 MG 24 hr capsule Commonly known as: INDERAL LA Take 60 mg by mouth daily.   Trelegy Ellipta 200-62.5-25 MCG/ACT Aepb Generic drug: Fluticasone-Umeclidin-Vilant Inhale 1 puff into the lungs daily. What changed: Another medication with the same name was removed. Continue taking this medication, and follow the directions you see here.        Discharge Exam: Filed Weights   02/19/23 2201  Weight: 124.2 kg   General-appears in no acute distress Heart-S1-S2, regular, no murmur auscultated Lungs-clear to auscultation bilaterally, no wheezing or crackles auscultated Abdomen-soft, nontender, no organomegaly Extremities-no edema in the lower extremities Neuro-alert, oriented x3, no focal deficit noted  Condition at discharge: good  The results of significant diagnostics from this hospitalization (including imaging, microbiology, ancillary and laboratory) are listed below for reference.   Imaging Studies: ECHOCARDIOGRAM COMPLETE  Result Date: 02/21/2023    ECHOCARDIOGRAM REPORT   Patient Name:   Manuel Castillo. Date of Exam: 02/21/2023 Medical Rec #:  [id number]        Height:       68.0 in Accession #:    [id number]       Weight:       273.8 lb Date of Birth:  Jan 03, 1946         BSA:          2.336 m Patient Age:    76 years         BP:            127/68 mmHg Patient Gender: M                HR:           62 bpm. Exam Location:  Inpatient Procedure: 2D Echo, Color Doppler, Cardiac Doppler and Intracardiac            Opacification Agent Indications:    Dyspnea  History:        Patient has no prior history of Echocardiogram examinations.  Sonographer:    Mike Gip Referring Phys: [id number] VASUNDHRA RATHORE IMPRESSIONS  1. Left ventricular ejection fraction, by estimation, is 60 to 65%. The left ventricle has normal function. The left ventricle has no regional wall motion abnormalities. Left ventricular diastolic parameters are consistent with Grade I diastolic dysfunction (impaired relaxation).  2. Right ventricular systolic function is normal. The right ventricular size is normal. There is normal pulmonary artery systolic pressure.  3. Left atrial size was moderately dilated.  4. The mitral valve is normal in structure. Trivial mitral valve regurgitation. No evidence of mitral stenosis.  5. The aortic valve  is normal in structure. Aortic valve regurgitation is not visualized. No aortic stenosis is present.  6. The inferior vena cava is dilated in size with <50% respiratory variability, suggesting right atrial pressure of 15 mmHg. FINDINGS  Left Ventricle: Left ventricular ejection fraction, by estimation, is 60 to 65%. The left ventricle has normal function. The left ventricle has no regional wall motion abnormalities. Definity contrast agent was given IV to delineate the left ventricular  endocardial borders. The left ventricular internal cavity size was normal in size. There is no left ventricular hypertrophy. Left ventricular diastolic parameters are consistent with Grade I diastolic dysfunction (impaired relaxation). Indeterminate filling pressures. Right Ventricle: The right ventricular size is normal. No increase in right ventricular wall thickness. Right ventricular systolic function is normal. There is normal pulmonary artery systolic pressure. The  tricuspid regurgitant velocity is 1.89 m/s, and  with an assumed right atrial pressure of 15 mmHg, the estimated right ventricular systolic pressure is 29.3 mmHg. Left Atrium: Left atrial size was moderately dilated. Right Atrium: Right atrial size was normal in size. Pericardium: There is no evidence of pericardial effusion. Mitral Valve: The mitral valve is normal in structure. Trivial mitral valve regurgitation. No evidence of mitral valve stenosis. Tricuspid Valve: The tricuspid valve is normal in structure. Tricuspid valve regurgitation is trivial. No evidence of tricuspid stenosis. Aortic Valve: The aortic valve is normal in structure. Aortic valve regurgitation is not visualized. No aortic stenosis is present. Pulmonic Valve: The pulmonic valve was normal in structure. Pulmonic valve regurgitation is trivial. No evidence of pulmonic stenosis. Aorta: The aortic root is normal in size and structure. Venous: The inferior vena cava is dilated in size with less than 50% respiratory variability, suggesting right atrial pressure of 15 mmHg. IAS/Shunts: No atrial level shunt detected by color flow Doppler.  LEFT VENTRICLE PLAX 2D LVIDd:         5.40 cm      Diastology LVIDs:         3.80 cm      LV e' medial:    8.38 cm/s LV PW:         1.00 cm      LV E/e' medial:  11.8 LV IVS:        1.00 cm      LV e' lateral:   8.38 cm/s LVOT diam:     2.00 cm      LV E/e' lateral: 11.8 LV SV:         90 LV SV Index:   39 LVOT Area:     3.14 cm  LV Volumes (MOD) LV vol d, MOD A2C: 172.0 ml LV vol d, MOD A4C: 151.0 ml LV vol s, MOD A2C: 55.7 ml LV vol s, MOD A4C: 52.5 ml LV SV MOD A2C:     116.3 ml LV SV MOD A4C:     151.0 ml LV SV MOD BP:      110.1 ml RIGHT VENTRICLE             IVC RV Basal diam:  3.90 cm     IVC diam: 2.70 cm RV S prime:     16.60 cm/s TAPSE (M-mode): 1.9 cm LEFT ATRIUM             Index        RIGHT ATRIUM           Index LA diam:        4.30 cm 1.84 cm/m   RA  Area:     15.70 cm LA Vol (A2C):   87.1 ml  37.29 ml/m  RA Volume:   33.20 ml  14.21 ml/m LA Vol (A4C):   80.2 ml 34.34 ml/m LA Biplane Vol: 90.1 ml 38.58 ml/m  AORTIC VALVE LVOT Vmax:   124.00 cm/s LVOT Vmean:  94.500 cm/s LVOT VTI:    0.288 m  AORTA Ao Root diam: 3.50 cm Ao Asc diam:  3.40 cm MITRAL VALVE                TRICUSPID VALVE MV Area (PHT): 3.85 cm     TR Peak grad:   14.3 mmHg MV Decel Time: 197 msec     TR Vmax:        189.00 cm/s MV E velocity: 99.00 cm/s MV A velocity: 100.00 cm/s  SHUNTS MV E/A ratio:  0.99         Systemic VTI:  0.29 m                             Systemic Diam: 2.00 cm Chilton Siiffany Tyrone MD Electronically signed by Chilton Siiffany [place] MD Signature Date/Time: 02/21/2023/11:17:56 AM    Final    CT Renal Stone Study  Result Date: 02/20/2023 CLINICAL DATA:  Abdominal pain.  Concern for kidney stone. EXAM: CT ABDOMEN AND PELVIS WITHOUT CONTRAST TECHNIQUE: Multidetector CT imaging of the abdomen and pelvis was performed following the standard protocol without IV contrast. RADIATION DOSE REDUCTION: This exam was performed according to the departmental dose-optimization program which includes automated exposure control, adjustment of the mA and/or kV according to patient size and/or use of iterative reconstruction technique. COMPARISON:  CT abdomen pelvis dated 07/17/2019. FINDINGS: Evaluation of this exam is limited in the absence of intravenous contrast. Lower chest: The visualized lung bases are clear. There is coronary vascular calcification. No intra-abdominal free air or free fluid. Hepatobiliary: Fatty liver. No biliary dilatation. The gallbladder is unremarkable. Pancreas: Unremarkable. No pancreatic ductal dilatation or surrounding inflammatory changes. Spleen: Normal in size without focal abnormality. Adrenals/Urinary Tract: The adrenal glands are unremarkable. There is no hydronephrosis or nephrolithiasis on either side. There is a 2 cm exophytic cyst arising from the interpolar right kidney. There is right perinephric  stranding which may be related to cyst rupture or infection. Correlation with urinalysis recommended. The visualized ureters appear unremarkable. The urinary bladder is minimally distended. Small foci of air within the bladder may have been introduced by recent instrumentation. Clinical correlation is recommended. Stomach/Bowel: There is sigmoid diverticulosis and scattered colonic diverticula without active inflammatory changes. There is no bowel obstruction or active inflammation. The appendix is normal. Vascular/Lymphatic: Mild aortoiliac atherosclerotic disease. The IVC is unremarkable. No portal venous gas. There is no adenopathy. Reproductive: The prostate and seminal vesicles are grossly unremarkable. No pelvic mass. Other: Prior bilateral inguinal hernia repair. Partially visualized 1.5 x 3.5 cm ovoid low attenuating structure in the right inguinal canal is not characterized on this CT but appears similar to prior CT and may represent a chronic collection or an area of fat necrosis. Musculoskeletal: Disc desiccation at L5-S1. No acute osseous pathology. IMPRESSION: 1. No hydronephrosis or nephrolithiasis. 2. Right perinephric stranding may be related to cyst rupture or infection. Correlation with urinalysis recommended. 3. Colonic diverticulosis. No bowel obstruction. Normal appendix. 4. Fatty liver. 5.  Aortic Atherosclerosis (ICD10-I70.0). Electronically Signed   By: Elgie CollardArash  Radparvar M.D.   On: 02/20/2023 01:20   DG Chest 2  View  Result Date: 02/19/2023 CLINICAL DATA:  Dyspnea EXAM: CHEST - 2 VIEW COMPARISON:  12/08/2019 FINDINGS: The heart size and mediastinal contours are within normal limits. Both lungs are clear. The visualized skeletal structures are unremarkable. IMPRESSION: No active cardiopulmonary disease. Electronically Signed   By: Helyn NumbersAshesh  Parikh M.D.   On: 02/19/2023 23:06    Microbiology: Results for orders placed or performed during the hospital encounter of 02/19/23  Culture, blood  (single)     Status: None (Preliminary result)   Collection Time: 02/20/23  1:45 AM   Specimen: BLOOD  Result Value Ref Range Status   Specimen Description   Final    BLOOD RIGHT ANTECUBITAL Performed at [facility], [address] [place], [place] [address]    Special Requests   Final    BOTTLES DRAWN AEROBIC AND ANAEROBIC Blood Culture adequate volume Performed at [facility], [address] [place], [place] [address]    Culture   Final    NO GROWTH 2 DAYS Performed at [facility], [address] [place], [place] [address]    Report Status PENDING  Incomplete  Urine Culture (for pregnant, neutropenic or urologic patients or patients with an indwelling urinary catheter)     Status: Abnormal   Collection Time: 02/20/23  7:15 AM   Specimen: Urine, Clean Catch  Result Value Ref Range Status   Specimen Description   Final    URINE, CLEAN CATCH Performed at [facility], [address] [place], [place] [address]    Special Requests   Final    NONE Performed at [facility], [address] [place], [place] [address]    Culture 20,000 COLONIES/mL PSEUDOMONAS AERUGINOSA (A)  Final   Report Status 02/22/2023 FINAL  Final   Organism ID, Bacteria PSEUDOMONAS AERUGINOSA (A)  Final      Susceptibility   Pseudomonas aeruginosa - MIC*    CEFTAZIDIME 4 SENSITIVE Sensitive     CIPROFLOXACIN 0.5 SENSITIVE Sensitive     GENTAMICIN <=1 SENSITIVE Sensitive     IMIPENEM 2 SENSITIVE Sensitive     PIP/TAZO 8 SENSITIVE Sensitive     CEFEPIME 4 SENSITIVE Sensitive     * 20,000 COLONIES/mL PSEUDOMONAS AERUGINOSA    Labs: CBC: Recent Labs  Lab 02/19/23 2236 02/20/23 0723 02/21/23 0359 02/22/23 0359  WBC 39.8* 32.9* 21.6* 14.9*  NEUTROABS  --  27.7*  --   --   HGB 14.4 12.8* 11.6* 12.0*  HCT 44.1 38.8* 35.5* 36.8*  MCV 95.9 95.1 96.5 96.3  PLT 224 175 152 151   Basic Metabolic Panel: Recent Labs   Lab 02/19/23 2236 02/20/23 0723 02/21/23 0359 02/22/23 0359  NA 136 134* 134* 136  K 4.4 4.1 3.6 4.2  CL 103 103 103 101  CO2 21* 21* 25 28  GLUCOSE 149* 152* 128* 138*  BUN 32* 34* 25* 17  CREATININE 2.06* 1.61* 1.17 1.00  CALCIUM 8.7* 8.2* 8.1* 8.4*   Liver Function Tests: Recent Labs  Lab 02/19/23 2236 02/20/23 0723 02/21/23 0359 02/22/23 0359  AST 27 22 19 18   ALT 24 21 21 22   ALKPHOS 49 47 57 71  BILITOT 2.2* 2.1* 1.1 1.3*  PROT 6.7 6.3* 6.1* 6.3*  ALBUMIN 3.7 3.3* 3.0* 3.1*   CBG: Recent Labs  Lab 02/19/23 2235  GLUCAP 165*    Discharge time spent: greater than 30 minutes.  Signed: Meredeth IdeGagan S Alva Broxson, MD [facility] 02/22/2023

## 2023-02-22 NOTE — Progress Notes (Signed)
[facility]  Date of Admission:  02/19/2023           Reason for visit: Follow up on urinary tract infection  Current antibiotics: Cefepime   ASSESSMENT:    77 y.o. male admitted with:  Pseudomonas aeruginosa UTI, possible pyelonephritis History of recurrent UTI Bladder cancer s/p TURBT and Gemcitabine/Docetaxel x 6 rounds Acute kidney injury Severe sepsis due to #1  RECOMMENDATIONS:    Will change to Levaquin 750mg  daily PO At this time, would not recommend prophylactic antibiotics for UTI prevention given its uncertain benefit and PsA likely to quickly acquire resistance Unclear how much of his frequent urinary symptoms could be related to chemotherapy bladder instillations vs UTI.  Suspect the latter this admission He appears at least colonized with Pseudomonas.  If after treatment, he develops recurrent UTI again with PsA would consider chronic prostatitis and possible treatment Also noted were his ciprofloxacin doses were on the lower end as an outpatient so that may explain why he has not cleared infections previously Will provide treatment through his upcoming cystoscopy on 4/15 as treatment and prophylaxis perioperatively Okay to discharge from ID perspective.  Please call as needed   Principal Problem:   Pyelonephritis Active Problems:   Essential hypertension   Dyspnea on exertion   Asthma, chronic   Severe sepsis   AKI (acute kidney injury)   Serum total bilirubin elevated   History of bladder cancer    MEDICATIONS:    Scheduled Meds:  enoxaparin (LOVENOX) injection  60 mg Subcutaneous Q24H   fluticasone furoate-vilanterol  1 puff Inhalation Daily   And   umeclidinium bromide  1 puff Inhalation Daily   ipratropium-albuterol  3 mL Nebulization TID   montelukast  10 mg Oral Daily   Continuous Infusions:  ceFEPime (MAXIPIME) IV 200 mL/hr at 02/22/23 0551   PRN Meds:.acetaminophen **OR** acetaminophen, albuterol,  hydrALAZINE  SUBJECTIVE:   24 hour events:  No acute events noted overnight Tmax 98.6 No new imaging Patient's outpatient urine culture also notable for Pseudomonas that is pan susceptible Improved WBC again today Creatinine 1.0 improving   He is feeling well and wants to go home.  He has a repeat cystoscopy scheduled for 4/15.  Review of Systems  All other systems reviewed and are negative.     OBJECTIVE:   Blood pressure (!) 146/73, pulse (!) 58, temperature 98 F (36.7 C), temperature source Oral, resp. rate 20, height 5\' 8"  (1.727 m), weight 124.2 kg, SpO2 97 %. Body mass index is 41.63 kg/m.  Physical Exam Constitutional:      Appearance: Normal appearance.  HENT:     Head: Normocephalic and atraumatic.  Eyes:     Extraocular Movements: Extraocular movements intact.     Conjunctiva/sclera: Conjunctivae normal.  Abdominal:     General: There is no distension.     Palpations: Abdomen is soft.  Musculoskeletal:        General: Normal range of motion.     Cervical back: Normal range of motion and neck supple.  Skin:    General: Skin is warm and dry.  Neurological:     General: No focal deficit present.     Mental Status: He is alert and oriented to person, place, and time.  Psychiatric:        Mood and Affect: Mood normal.        Behavior: Behavior normal.      Lab Results: Lab Results  Component Value  Date   WBC 14.9 (H) 02/22/2023   HGB 12.0 (L) 02/22/2023   HCT 36.8 (L) 02/22/2023   MCV 96.3 02/22/2023   PLT 151 02/22/2023    Lab Results  Component Value Date   NA 136 02/22/2023   K 4.2 02/22/2023   CO2 28 02/22/2023   GLUCOSE 138 (H) 02/22/2023   BUN 17 02/22/2023   CREATININE 1.00 02/22/2023   CALCIUM 8.4 (L) 02/22/2023   GFRNONAA >60 02/22/2023   GFRAA >60 12/14/2019    Lab Results  Component Value Date   ALT 22 02/22/2023   AST 18 02/22/2023   ALKPHOS 71 02/22/2023   BILITOT 1.3 (H) 02/22/2023       Component Value Date/Time    CRP 0.6 12/14/2019 0310    No results found for: "ESRSEDRATE"   I have reviewed the micro and lab results in Epic.  Imaging: ECHOCARDIOGRAM COMPLETE  Result Date: 02/21/2023    ECHOCARDIOGRAM REPORT   Patient Name:   Manuel Castillo. Date of Exam: 02/21/2023 Medical Rec #:  [id number]        Height:       68.0 in Accession #:    [id number]       Weight:       273.8 lb Date of Birth:  March 07, 1946         BSA:          2.336 m Patient Age:    76 years         BP:           127/68 mmHg Patient Gender: M                HR:           62 bpm. Exam Location:  Inpatient Procedure: 2D Echo, Color Doppler, Cardiac Doppler and Intracardiac            Opacification Agent Indications:    Dyspnea  History:        Patient has no prior history of Echocardiogram examinations.  Sonographer:    Mike Gip Referring Phys: [id number] VASUNDHRA RATHORE IMPRESSIONS  1. Left ventricular ejection fraction, by estimation, is 60 to 65%. The left ventricle has normal function. The left ventricle has no regional wall motion abnormalities. Left ventricular diastolic parameters are consistent with Grade I diastolic dysfunction (impaired relaxation).  2. Right ventricular systolic function is normal. The right ventricular size is normal. There is normal pulmonary artery systolic pressure.  3. Left atrial size was moderately dilated.  4. The mitral valve is normal in structure. Trivial mitral valve regurgitation. No evidence of mitral stenosis.  5. The aortic valve is normal in structure. Aortic valve regurgitation is not visualized. No aortic stenosis is present.  6. The inferior vena cava is dilated in size with <50% respiratory variability, suggesting right atrial pressure of 15 mmHg. FINDINGS  Left Ventricle: Left ventricular ejection fraction, by estimation, is 60 to 65%. The left ventricle has normal function. The left ventricle has no regional wall motion abnormalities. Definity contrast agent was given IV to delineate the left  ventricular  endocardial borders. The left ventricular internal cavity size was normal in size. There is no left ventricular hypertrophy. Left ventricular diastolic parameters are consistent with Grade I diastolic dysfunction (impaired relaxation). Indeterminate filling pressures. Right Ventricle: The right ventricular size is normal. No increase in right ventricular wall thickness. Right ventricular systolic function is normal. There is normal pulmonary artery systolic pressure. The tricuspid regurgitant  velocity is 1.89 m/s, and  with an assumed right atrial pressure of 15 mmHg, the estimated right ventricular systolic pressure is 29.3 mmHg. Left Atrium: Left atrial size was moderately dilated. Right Atrium: Right atrial size was normal in size. Pericardium: There is no evidence of pericardial effusion. Mitral Valve: The mitral valve is normal in structure. Trivial mitral valve regurgitation. No evidence of mitral valve stenosis. Tricuspid Valve: The tricuspid valve is normal in structure. Tricuspid valve regurgitation is trivial. No evidence of tricuspid stenosis. Aortic Valve: The aortic valve is normal in structure. Aortic valve regurgitation is not visualized. No aortic stenosis is present. Pulmonic Valve: The pulmonic valve was normal in structure. Pulmonic valve regurgitation is trivial. No evidence of pulmonic stenosis. Aorta: The aortic root is normal in size and structure. Venous: The inferior vena cava is dilated in size with less than 50% respiratory variability, suggesting right atrial pressure of 15 mmHg. IAS/Shunts: No atrial level shunt detected by color flow Doppler.  LEFT VENTRICLE PLAX 2D LVIDd:         5.40 cm      Diastology LVIDs:         3.80 cm      LV e' medial:    8.38 cm/s LV PW:         1.00 cm      LV E/e' medial:  11.8 LV IVS:        1.00 cm      LV e' lateral:   8.38 cm/s LVOT diam:     2.00 cm      LV E/e' lateral: 11.8 LV SV:         90 LV SV Index:   39 LVOT Area:     3.14 cm  LV  Volumes (MOD) LV vol d, MOD A2C: 172.0 ml LV vol d, MOD A4C: 151.0 ml LV vol s, MOD A2C: 55.7 ml LV vol s, MOD A4C: 52.5 ml LV SV MOD A2C:     116.3 ml LV SV MOD A4C:     151.0 ml LV SV MOD BP:      110.1 ml RIGHT VENTRICLE             IVC RV Basal diam:  3.90 cm     IVC diam: 2.70 cm RV S prime:     16.60 cm/s TAPSE (M-mode): 1.9 cm LEFT ATRIUM             Index        RIGHT ATRIUM           Index LA diam:        4.30 cm 1.84 cm/m   RA Area:     15.70 cm LA Vol (A2C):   87.1 ml 37.29 ml/m  RA Volume:   33.20 ml  14.21 ml/m LA Vol (A4C):   80.2 ml 34.34 ml/m LA Biplane Vol: 90.1 ml 38.58 ml/m  AORTIC VALVE LVOT Vmax:   124.00 cm/s LVOT Vmean:  94.500 cm/s LVOT VTI:    0.288 m  AORTA Ao Root diam: 3.50 cm Ao Asc diam:  3.40 cm MITRAL VALVE                TRICUSPID VALVE MV Area (PHT): 3.85 cm     TR Peak grad:   14.3 mmHg MV Decel Time: 197 msec     TR Vmax:        189.00 cm/s MV E velocity: 99.00 cm/s MV A velocity: 100.00 cm/s  SHUNTS MV E/A ratio:  0.99         Systemic VTI:  0.29 m                             Systemic Diam: 2.00 cm Chilton Siiffany Bradley MD Electronically signed by Chilton Siiffany  MD Signature Date/Time: 02/21/2023/11:17:56 AM    Final      Imaging independently reviewed in Epic.    Vedia CofferAndrew N Paytan [facility] [phone] pager 02/22/2023, 8:06 AM

## 2023-02-25 LAB — CULTURE, BLOOD (SINGLE)
Culture: NO GROWTH
Special Requests: ADEQUATE

## 2023-03-20 ENCOUNTER — Encounter: Payer: Self-pay | Admitting: Pulmonary Disease

## 2023-03-20 ENCOUNTER — Ambulatory Visit: Payer: PPO | Admitting: Pulmonary Disease

## 2023-03-20 VITALS — BP 140/86 | HR 53 | Ht 68.5 in | Wt 270.6 lb

## 2023-03-20 DIAGNOSIS — J4540 Moderate persistent asthma, uncomplicated: Secondary | ICD-10-CM

## 2023-03-20 MED ORDER — TRELEGY ELLIPTA 200-62.5-25 MCG/ACT IN AEPB: 1.0000 | INHALATION_SPRAY | Freq: Every day | RESPIRATORY_TRACT | 0 refills | Status: DC

## 2023-03-20 NOTE — Progress Notes (Signed)
@Patient  ID: Manuel Johns., male    DOB: November 27, 1945, 77 y.o.   MRN: [id number]  Chief Complaint  Patient presents with   Follow-up    Referring provider: Roe Coombs*  HPI:   77 y.o. man whom we are seeing in follow up for evaluation of recurrent dyspnea, cough, response of the prednisone presumed COPD with conflicting information regarding recent PFT results in care everywhere, reportedly normal which is not consistent for COPD.  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  Overall breathing stable.  Last seen several months ago.  Has been tied up with urologic appointments.  Multiple resections of bladder cancer.  Complicated by recurrent bladder infections.  Most recently hospitalized 01/2023, discharge summary reviewed.  His breathing has been fairly well.  He does get some wheezing and more symptomatic particularly is weak and ill with infections he states.  Usually he needs to rest more.  Things will get better.  Albuterol continues to help.  He reports good adherence to Trelegy high-dose.  HPI initial visit Patient with longstanding history of shortness of breath, over the last couple years with pandemic.  Seems worse over the last year or so.  On average it seems like he is getting 1-2 prednisone courses a month over the last 6 months.  Symptoms include productive cough, increased dyspnea, wheeze.  Prednisone antibiotics resolved this quite well.  However within a week or 2 after finishing prednisone course symptoms recur requiring repeat courses of treatment.  He does endorse seasonal allergies but is not sure I does not think that changes in seasons has made things better or worse.  No time of day when symptoms are better or worse.  Previous on Symbicort.  Placed on montelukast and Trelegy within the last couple weeks.  He thinks Trelegy has helped and improved his symptoms compared to what he  felt like on Symbicort.  Notably completed his most recent course of prednisone about 7 to 10 days ago.  Overall feeling better than he normally does over this timeframe around stopping prednisone courses.  He reports recurrent nasal congestion.  Much improved with montelukast and fluticasone nasal spray.  Reviewed CT chest 02/2020 that on my review interpretation reveals mild scattered groundglass bilaterally improved compared to 11/2019, thickened bronchioles, mild mosaicism.  PMH: Tobacco abuse in remission, chronic bronchitis/asthma, hypertension, seasonal allergies Surgical history: Hernia repair Family history: Mother with hypertension, father with hypertension, lung cancer   Questionaires / Pulmonary Flowsheets:   ACT:      No data to display           MMRC:     No data to display           Epworth:      No data to display           Tests:   FENO:  No results found for: "NITRICOXIDE"  PFT:     No data to display           WALK:      No data to display           Imaging: Personally reviewed and as per EMR discussion in this note  Lab Results: Personally reviewed, notably eosinophils as high as 1520 21, more recently [phone] despite multiple courses of prednisone CBC    Component Value Date/Time   WBC 14.9 (H) 02/22/2023 0359   RBC 3.82 (L) 02/22/2023 0359   HGB  12.0 (L) 02/22/2023 0359   HCT 36.8 (L) 02/22/2023 0359   PLT 151 02/22/2023 0359   MCV 96.3 02/22/2023 0359   MCH 31.4 02/22/2023 0359   MCHC 32.6 02/22/2023 0359   RDW 16.5 (H) 02/22/2023 0359   LYMPHSABS 1.5 02/20/2023 0723   MONOABS 3.0 (H) 02/20/2023 0723   EOSABS 0.1 02/20/2023 0723   BASOSABS 0.1 02/20/2023 0723    BMET    Component Value Date/Time   NA 136 02/22/2023 0359   K 4.2 02/22/2023 0359   CL 101 02/22/2023 0359   CO2 28 02/22/2023 0359   GLUCOSE 138 (H) 02/22/2023 0359   BUN 17 02/22/2023 0359   CREATININE 1.00 02/22/2023 0359   CALCIUM 8.4  (L) 02/22/2023 0359   GFRNONAA >60 02/22/2023 0359   GFRAA >60 12/14/2019 0310    BNP    Component Value Date/Time   BNP 76.2 12/05/2019 2015    ProBNP No results found for: "PROBNP"  Specialty Problems       Pulmonary Problems   Asthma, chronic   Dyspnea on exertion    No Known Allergies  Immunization History  Administered Date(s) Administered   Fluad Quad(high Dose 65+) 08/20/2019   Influenza, High Dose Seasonal PF 10/04/2018, 09/03/2019   Influenza-Unspecified 08/20/2019, 09/30/2021   Moderna Sars-Covid-2 Vaccination 12/09/2019, 03/09/2020    Past Medical History:  Diagnosis Date   COVID-19     Tobacco History: Social History   Tobacco Use  Smoking Status Former   Packs/day: 1   Types: Cigarettes   Quit date: 03/03/2013   Years since quitting: 10.0  Smokeless Tobacco Never  Tobacco Comments   quit 8 years ago   Counseling given: Not Answered Tobacco comments: quit 8 years ago   Continue to not smoke  Outpatient Encounter Medications as of 03/20/2023  Medication Sig   albuterol (PROVENTIL HFA;VENTOLIN HFA) 108 (90 BASE) MCG/ACT inhaler Inhale 1-2 puffs into the lungs every 6 (six) hours as needed for wheezing.   blood glucose meter kit and supplies Dispense based on patient and insurance preference. Use up to four times daily as directed. (FOR ICD-10 E10.9, E11.9).   fluticasone (FLONASE) 50 MCG/ACT nasal spray Place 1-2 sprays into both nostrils daily as needed (congestion.).   Fluticasone-Umeclidin-Vilant (TRELEGY ELLIPTA) 200-62.5-25 MCG/ACT AEPB Inhale 1 puff into the lungs daily.   montelukast (SINGULAIR) 10 MG tablet Take 10 mg by mouth daily.   oxybutynin (DITROPAN-XL) 5 MG 24 hr tablet Take 5 mg by mouth daily as needed.   propranolol ER (INDERAL LA) 60 MG 24 hr capsule Take 60 mg by mouth daily.   No facility-administered encounter medications on file as of 03/20/2023.     Review of Systems  Review of Systems  N/a Physical Exam  BP (!)  140/86 (BP Location: Right Arm, Cuff Size: Normal)   Pulse 97   Ht 5' 8.5" (1.74 m)   Wt 270 lb 9.6 oz (122.7 kg)   SpO2 (!) 53%   BMI 40.55 kg/m   Wt Readings from Last 5 Encounters:  03/20/23 270 lb 9.6 oz (122.7 kg)  02/19/23 273 lb 12.8 oz (124.2 kg)  06/13/22 273 lb 12.8 oz (124.2 kg)  03/14/22 277 lb (125.6 kg)  11/29/21 277 lb 9.6 oz (125.9 kg)    BMI Readings from Last 5 Encounters:  03/20/23 40.55 kg/m  02/19/23 41.63 kg/m  06/13/22 41.63 kg/m  03/14/22 42.12 kg/m  11/29/21 42.21 kg/m     Physical Exam General: Well-appearing, no distress Eyes:  EOMI, icterus Neck: Supple, no JVP Pulmonary: Clear, no wheeze, normal work of breathing Cardiovascular: Regular rate and rhythm, no murmur Abdomen: Nondistended, bowel sounds present MSK: No synovitis, no joint effusion Neuro: Normal gait, no weakness Psych: Normal mood, full affect   Assessment & Plan:   Dyspnea, cough: Probably related to asthma with eosinophilic component.  Improved initially with low-dose Trelegy. Further improved with high-dose Trelegy.  We will continue high-dose ICS/LABA/LAMA therapy as below.  Asthma: Atopic symptoms, eosinophilia, recurrent steroid responsive respiratory disease.  Symptoms initially improved but now a bit worse with worsening pollens.  Escalate low-dose to high-dose Trelegy 02/2022 with improvement.  To continue.  Recently refilled.  Symptoms much better controlled on Trelegy without recent exacerbation.  Lung nodule: 6 mm RUL 02/2020, repeat CT 12/2021 all stable, no further follow-up needed.  Return in about 6 months (around 09/20/2023) for f/u Dr. Judeth Horn.   Karren Burly, MD 03/20/2023

## 2023-03-20 NOTE — Patient Instructions (Addendum)
Nice to see you again  No changes to medicine  Continue the Trelegy once a day every day and albuterol as needed  I provided samples, each sample inhaler has 14 days worth of doses.  Return to clinic in 6 months or sooner if needed with Dr. Judeth Horn

## 2023-03-30 ENCOUNTER — Other Ambulatory Visit: Payer: Self-pay | Admitting: Pulmonary Disease

## 2024-03-26 ENCOUNTER — Ambulatory Visit: Admitting: Pulmonary Disease

## 2024-03-26 ENCOUNTER — Encounter: Payer: Self-pay | Admitting: Pulmonary Disease

## 2024-03-26 VITALS — BP 129/81 | HR 72 | Ht 68.0 in | Wt 275.2 lb

## 2024-03-26 DIAGNOSIS — J4540 Moderate persistent asthma, uncomplicated: Secondary | ICD-10-CM

## 2024-03-26 DIAGNOSIS — J45909 Unspecified asthma, uncomplicated: Secondary | ICD-10-CM

## 2024-03-26 MED ORDER — TRELEGY ELLIPTA 200-62.5-25 MCG/ACT IN AEPB: 1.0000 | INHALATION_SPRAY | Freq: Every day | RESPIRATORY_TRACT | 12 refills | Status: AC

## 2024-03-26 NOTE — Progress Notes (Signed)
 @Patient  ID: Manuel Castillo., male    DOB: [birth year], 78 y.o.   MRN: [id number]  Chief Complaint  Patient presents with   Follow-up    Referring provider: Silvester Drown*  HPI:   78 y.o. man whom we are seeing in follow up for evaluation of recurrent dyspnea, cough, response of the prednisone presumed COPD with conflicting information regarding recent PFT results in care everywhere, reportedly normal which is not consistent for COPD.  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  Overall breathing stable.  Last seen 1 year ago.  Denies significant cough.  Maybe little bit worse with the pollen but overall doing fine.  Be worked up for other causes of shortness of breath.  Meeting with a cardiologist soon.  Has had stress test and echo in the interim.  This demonstrated dilated left atrium, diastolic dysfunction, otherwise relatively reassuring.  He is feeling bit better.  Even like that with his cancer treatments lately.  Been getting back on his feet.  Back to playing golf a little bit.  Which he greatly enjoys.  HPI initial visit Patient with longstanding history of shortness of breath, over the last couple years with pandemic.  Seems worse over the last year or so.  On average it seems like he is getting 1-2 prednisone courses a month over the last 6 months.  Symptoms include productive cough, increased dyspnea, wheeze.  Prednisone antibiotics resolved this quite well.  However within a week or 2 after finishing prednisone course symptoms recur requiring repeat courses of treatment.  He does endorse seasonal allergies but is not sure I does not think that changes in seasons has made things better or worse.  No time of day when symptoms are better or worse.  Previous on Symbicort.  Placed on montelukast  and Trelegy within the last couple weeks.  He thinks Trelegy has  helped and improved his symptoms compared to what he felt like on Symbicort.  Notably completed his most recent course of prednisone about 7 to 10 days ago.  Overall feeling better than he normally does over this timeframe around stopping prednisone courses.  He reports recurrent nasal congestion.  Much improved with montelukast  and fluticasone  nasal spray.  Reviewed CT chest 02/2020 that on my review interpretation reveals mild scattered groundglass bilaterally improved compared to 11/2019, thickened bronchioles, mild mosaicism.  PMH: Tobacco abuse in remission, chronic bronchitis/asthma, hypertension, seasonal allergies Surgical history: Hernia repair Family history: Mother with hypertension, father with hypertension, lung cancer   Questionaires / Pulmonary Flowsheets:   ACT:  Asthma Control Test ACT Total Score  03/26/2024  9:24 AM 18    MMRC:     No data to display          Epworth:      No data to display          Tests:   FENO:  No results found for: "NITRICOXIDE"  PFT:     No data to display          WALK:      No data to display          Imaging: Personally reviewed and as per EMR discussion in this note  Lab Results: Personally reviewed, notably eosinophils as high as 1520 21, more recently [id number] despite multiple courses of prednisone CBC    Component Value Date/Time   WBC 14.9 (H) 02/22/2023 0359  RBC 3.82 (L) 02/22/2023 0359   HGB 12.0 (L) 02/22/2023 0359   HCT 36.8 (L) 02/22/2023 0359   PLT 151 02/22/2023 0359   MCV 96.3 02/22/2023 0359   MCH 31.4 02/22/2023 0359   MCHC 32.6 02/22/2023 0359   RDW 16.5 (H) 02/22/2023 0359   LYMPHSABS 1.5 02/20/2023 0723   MONOABS 3.0 (H) 02/20/2023 0723   EOSABS 0.1 02/20/2023 0723   BASOSABS 0.1 02/20/2023 0723    BMET    Component Value Date/Time   NA 136 02/22/2023 0359   K 4.2 02/22/2023 0359   CL 101 02/22/2023 0359   CO2 28 02/22/2023 0359   GLUCOSE 138 (H) 02/22/2023 0359   BUN  17 02/22/2023 0359   CREATININE 1.00 02/22/2023 0359   CALCIUM 8.4 (L) 02/22/2023 0359   GFRNONAA >60 02/22/2023 0359   GFRAA >60 12/14/2019 0310    BNP    Component Value Date/Time   BNP 76.2 12/05/2019 2015    ProBNP No results found for: "PROBNP"  Specialty Problems       Pulmonary Problems   Asthma, chronic   Dyspnea on exertion    No Known Allergies  Immunization History  Administered Date(s) Administered   Fluad Quad(high Dose 65+) 08/20/2019   Influenza, High Dose Seasonal PF 10/04/2018, 09/03/2019   Influenza-Unspecified 08/20/2019, 09/30/2021   Moderna Sars-Covid-2 Vaccination 12/09/2019, 03/09/2020    Past Medical History:  Diagnosis Date   COVID-19     Tobacco History: Social History   Tobacco Use  Smoking Status Former   Current packs/day: 0.00   Types: Cigarettes   Quit date: 03/03/2013   Years since quitting: 11.0  Smokeless Tobacco Never  Tobacco Comments   quit 8 years ago   Counseling given: Not Answered Tobacco comments: quit 8 years ago   Continue to not smoke  Outpatient Encounter Medications as of 03/26/2024  Medication Sig   albuterol  (PROVENTIL  HFA;VENTOLIN  HFA) 108 (90 BASE) MCG/ACT inhaler Inhale 1-2 puffs into the lungs every 6 (six) hours as needed for wheezing.   aspirin  EC 81 MG tablet Take 1 tablet by mouth daily.   carvedilol (COREG) 3.125 MG tablet Take 3.125 mg by mouth 2 (two) times daily with a meal.   cefadroxil (DURICEF) 500 MG capsule Take 500 mg by mouth 2 (two) times daily.   fluticasone  (FLONASE ) 50 MCG/ACT nasal spray Place 1-2 sprays into both nostrils daily as needed (congestion.).   Fluticasone  Propionate (XHANCE ) 93 MCG/ACT EXHU Place into the nose.   lisinopril  (ZESTRIL ) 30 MG tablet Take 30 mg by mouth daily.   montelukast  (SINGULAIR ) 10 MG tablet Take 10 mg by mouth daily.   Semaglutide,0.25 or 0.5MG /DOS, (OZEMPIC, 0.25 OR 0.5 MG/DOSE,) 2 MG/3ML SOPN Inject into the skin.   [DISCONTINUED] TRELEGY  ELLIPTA 200-62.5-25 MCG/ACT AEPB TAKE 1 PUFF BY MOUTH EVERY DAY   Fluticasone -Umeclidin-Vilant (TRELEGY ELLIPTA ) 200-62.5-25 MCG/ACT AEPB Inhale 1 puff into the lungs daily.   [DISCONTINUED] blood glucose meter kit and supplies Dispense based on patient and insurance preference. Use up to four times daily as directed. (FOR ICD-10 E10.9, E11.9). (Patient not taking: Reported on 03/26/2024)   [DISCONTINUED] oxybutynin (DITROPAN-XL) 5 MG 24 hr tablet Take 5 mg by mouth daily as needed. (Patient not taking: Reported on 03/26/2024)   [DISCONTINUED] propranolol ER (INDERAL LA) 60 MG 24 hr capsule Take 60 mg by mouth daily. (Patient not taking: Reported on 03/26/2024)   No facility-administered encounter medications on file as of 03/26/2024.     Review of Systems  Review of Systems  N/a Physical Exam  BP 129/81 (BP Location: Left Arm, Patient Position: Sitting, Cuff Size: Large)   Pulse 72   Ht 5\' 8"  (1.727 m)   Wt 275 lb 3.2 oz (124.8 kg)   SpO2 92%   BMI 41.84 kg/m   Wt Readings from Last 5 Encounters:  03/26/24 275 lb 3.2 oz (124.8 kg)  03/20/23 270 lb 9.6 oz (122.7 kg)  02/19/23 273 lb 12.8 oz (124.2 kg)  06/13/22 273 lb 12.8 oz (124.2 kg)  03/14/22 277 lb (125.6 kg)    BMI Readings from Last 5 Encounters:  03/26/24 41.84 kg/m  03/20/23 40.55 kg/m  02/19/23 41.63 kg/m  06/13/22 41.63 kg/m  03/14/22 42.12 kg/m     Physical Exam General: Well-appearing, no distress Eyes: EOMI, icterus Neck: Supple, no JVP Pulmonary: Clear, no wheeze, normal work of breathing Cardiovascular: Regular rate and rhythm, no murmur Abdomen: Nondistended, bowel sounds present MSK: No synovitis, no joint effusion Neuro: Normal gait, no weakness Psych: Normal mood, full affect   Assessment & Plan:   Dyspnea, cough: Probably related to asthma with eosinophilic component.  Improved initially with low-dose Trelegy. Further improved with high-dose Trelegy.  We will continue high-dose ICS/LABA/LAMA  therapy as below.  Ongoing cardiac workup.  Asthma: Atopic symptoms, eosinophilia, recurrent steroid responsive respiratory disease.  Symptoms initially improved but now a bit worse with worsening pollens.  Escalate low-dose to high-dose Trelegy 02/2022 with improvement.  To continue, refill today.  Symptoms much better controlled on Trelegy without recent exacerbation.   Return in about 1 year (around 03/26/2025) for f/u Dr. Marygrace Snellen.   Guerry Leek, MD 03/26/2024

## 2024-03-26 NOTE — Patient Instructions (Signed)
 Continue Trelegy, refilled today  Contact me if I can help in any way if breathing worsens or cough worsens or anything you think I can help with  Return to clinic in a year or sooner if needed with Dr. Johnnette Nakayama

## 2024-12-02 ENCOUNTER — Ambulatory Visit: Admitting: Neurology
